# Patient Record
Sex: Female | Born: 1975
Health system: Southern US, Community
[De-identification: ages and names within clinical notes are randomized; demographics above are authoritative.]

## PROBLEM LIST (undated history)

## (undated) DIAGNOSIS — E669 Obesity, unspecified: Secondary | ICD-10-CM

## (undated) DIAGNOSIS — R945 Abnormal results of liver function studies: Secondary | ICD-10-CM

## (undated) DIAGNOSIS — K8020 Calculus of gallbladder without cholecystitis without obstruction: Secondary | ICD-10-CM

## (undated) DIAGNOSIS — J309 Allergic rhinitis, unspecified: Secondary | ICD-10-CM

## (undated) DIAGNOSIS — R7303 Prediabetes: Secondary | ICD-10-CM

## (undated) DIAGNOSIS — O039 Complete or unspecified spontaneous abortion without complication: Secondary | ICD-10-CM

## (undated) DIAGNOSIS — D509 Iron deficiency anemia, unspecified: Secondary | ICD-10-CM

## (undated) DIAGNOSIS — M6740 Ganglion, unspecified site: Secondary | ICD-10-CM

## (undated) DIAGNOSIS — E785 Hyperlipidemia, unspecified: Secondary | ICD-10-CM

## (undated) DIAGNOSIS — T7840XA Allergy, unspecified, initial encounter: Secondary | ICD-10-CM

## (undated) DIAGNOSIS — H109 Unspecified conjunctivitis: Secondary | ICD-10-CM

## (undated) DIAGNOSIS — N946 Dysmenorrhea, unspecified: Secondary | ICD-10-CM

## (undated) DIAGNOSIS — K649 Unspecified hemorrhoids: Secondary | ICD-10-CM

## (undated) DIAGNOSIS — I10 Essential (primary) hypertension: Secondary | ICD-10-CM

## (undated) HISTORY — PX: GANGLION CYST EXCISION: SHX1691

## (undated) HISTORY — DX: Hyperlipidemia, unspecified: E78.5

## (undated) HISTORY — DX: Dysmenorrhea, unspecified: N94.6

## (undated) HISTORY — DX: Essential (primary) hypertension: I10

## (undated) HISTORY — DX: Ganglion, unspecified site: M67.40

## (undated) HISTORY — DX: Allergy, unspecified, initial encounter: T78.40XA

## (undated) HISTORY — DX: Unspecified conjunctivitis: H10.9

## (undated) HISTORY — DX: Abnormal results of liver function studies: R94.5

## (undated) HISTORY — DX: Iron deficiency anemia, unspecified: D50.9

## (undated) HISTORY — DX: Unspecified hemorrhoids: K64.9

## (undated) HISTORY — DX: Allergic rhinitis, unspecified: J30.9

## (undated) HISTORY — DX: Complete or unspecified spontaneous abortion without complication: O03.9

## (undated) HISTORY — DX: Obesity, unspecified: E66.9

## (undated) HISTORY — DX: Calculus of gallbladder without cholecystitis without obstruction: K80.20

---

## 1999-11-25 ENCOUNTER — Other Ambulatory Visit: Admission: RE | Admit: 1999-11-25 | Discharge: 1999-11-25 | Payer: Self-pay | Admitting: Family Medicine

## 2000-12-01 ENCOUNTER — Other Ambulatory Visit: Admission: RE | Admit: 2000-12-01 | Discharge: 2000-12-01 | Payer: Self-pay | Admitting: Family Medicine

## 2002-04-19 ENCOUNTER — Other Ambulatory Visit: Admission: RE | Admit: 2002-04-19 | Discharge: 2002-04-19 | Payer: Self-pay | Admitting: Family Medicine

## 2003-06-05 ENCOUNTER — Other Ambulatory Visit: Admission: RE | Admit: 2003-06-05 | Discharge: 2003-06-05 | Payer: Self-pay | Admitting: Family Medicine

## 2005-07-28 ENCOUNTER — Encounter: Payer: Self-pay | Admitting: Family Medicine

## 2005-07-28 ENCOUNTER — Ambulatory Visit: Payer: Self-pay | Admitting: Family Medicine

## 2005-07-28 ENCOUNTER — Other Ambulatory Visit: Admission: RE | Admit: 2005-07-28 | Discharge: 2005-07-28 | Payer: Self-pay | Admitting: Family Medicine

## 2005-07-28 LAB — CONVERTED CEMR LAB: Pap Smear: NORMAL

## 2005-09-03 ENCOUNTER — Ambulatory Visit: Payer: Self-pay | Admitting: Family Medicine

## 2005-10-03 ENCOUNTER — Ambulatory Visit: Payer: Self-pay | Admitting: Family Medicine

## 2005-10-13 ENCOUNTER — Ambulatory Visit: Payer: Self-pay | Admitting: Family Medicine

## 2005-10-20 ENCOUNTER — Ambulatory Visit: Payer: Self-pay | Admitting: Family Medicine

## 2005-11-17 HISTORY — PX: CHOLECYSTECTOMY: SHX55

## 2005-12-03 ENCOUNTER — Ambulatory Visit (HOSPITAL_COMMUNITY): Admission: RE | Admit: 2005-12-03 | Discharge: 2005-12-03 | Payer: Self-pay | Admitting: General Surgery

## 2005-12-05 ENCOUNTER — Ambulatory Visit: Payer: Self-pay | Admitting: Family Medicine

## 2006-01-14 ENCOUNTER — Other Ambulatory Visit: Admission: RE | Admit: 2006-01-14 | Discharge: 2006-01-14 | Payer: Self-pay | Admitting: Obstetrics and Gynecology

## 2006-07-21 ENCOUNTER — Inpatient Hospital Stay (HOSPITAL_COMMUNITY): Admission: AD | Admit: 2006-07-21 | Discharge: 2006-07-21 | Payer: Self-pay | Admitting: Obstetrics and Gynecology

## 2006-08-07 ENCOUNTER — Inpatient Hospital Stay (HOSPITAL_COMMUNITY): Admission: AD | Admit: 2006-08-07 | Discharge: 2006-08-09 | Payer: Self-pay | Admitting: Obstetrics and Gynecology

## 2006-11-13 ENCOUNTER — Ambulatory Visit: Payer: Self-pay | Admitting: General Surgery

## 2006-11-17 DIAGNOSIS — O039 Complete or unspecified spontaneous abortion without complication: Secondary | ICD-10-CM

## 2006-11-17 HISTORY — DX: Complete or unspecified spontaneous abortion without complication: O03.9

## 2007-08-16 ENCOUNTER — Encounter: Payer: Self-pay | Admitting: Family Medicine

## 2007-08-16 DIAGNOSIS — R945 Abnormal results of liver function studies: Secondary | ICD-10-CM

## 2007-08-16 DIAGNOSIS — K8020 Calculus of gallbladder without cholecystitis without obstruction: Secondary | ICD-10-CM | POA: Insufficient documentation

## 2007-08-16 DIAGNOSIS — J309 Allergic rhinitis, unspecified: Secondary | ICD-10-CM | POA: Insufficient documentation

## 2007-08-16 DIAGNOSIS — N946 Dysmenorrhea, unspecified: Secondary | ICD-10-CM | POA: Insufficient documentation

## 2007-08-16 HISTORY — DX: Abnormal results of liver function studies: R94.5

## 2007-08-26 ENCOUNTER — Ambulatory Visit: Payer: Self-pay | Admitting: Family Medicine

## 2007-08-26 DIAGNOSIS — D509 Iron deficiency anemia, unspecified: Secondary | ICD-10-CM | POA: Insufficient documentation

## 2007-08-27 LAB — CONVERTED CEMR LAB
ALT: 46 units/L — ABNORMAL HIGH (ref 0–35)
AST: 33 units/L (ref 0–37)
Albumin: 4.1 g/dL (ref 3.5–5.2)
Alkaline Phosphatase: 91 units/L (ref 39–117)
Basophils Absolute: 0.1 10*3/uL (ref 0.0–0.1)
Basophils Relative: 0.8 % (ref 0.0–1.0)
Bilirubin, Direct: 0.1 mg/dL (ref 0.0–0.3)
Cholesterol: 193 mg/dL (ref 0–200)
Direct LDL: 134.7 mg/dL
Eosinophils Absolute: 0.2 10*3/uL (ref 0.0–0.6)
Eosinophils Relative: 2.1 % (ref 0.0–5.0)
Glucose, Bld: 99 mg/dL (ref 70–99)
HCT: 38.8 % (ref 36.0–46.0)
HDL: 28.1 mg/dL — ABNORMAL LOW (ref >39.0)
Hemoglobin: 13.2 g/dL (ref 12.0–15.0)
Lymphocytes Relative: 21.2 % (ref 12.0–46.0)
MCHC: 34.1 g/dL (ref 30.0–36.0)
MCV: 80.6 fL (ref 78.0–100.0)
Monocytes Absolute: 0.7 10*3/uL (ref 0.2–0.7)
Monocytes Relative: 7.2 % (ref 3.0–11.0)
Neutro Abs: 7.1 10*3/uL (ref 1.4–7.7)
Neutrophils Relative %: 68.7 % (ref 43.0–77.0)
Platelets: 311 10*3/uL (ref 150–400)
RBC: 4.81 M/uL (ref 3.87–5.11)
RDW: 14 % (ref 11.5–14.6)
Total Bilirubin: 0.8 mg/dL (ref 0.3–1.2)
Total CHOL/HDL Ratio: 6.9
Total Protein: 8.1 g/dL (ref 6.0–8.3)
Triglycerides: 243 mg/dL (ref 0–149)
VLDL: 49 mg/dL — ABNORMAL HIGH (ref 0–40)
WBC: 10.3 10*3/uL (ref 4.5–10.5)

## 2008-01-26 ENCOUNTER — Ambulatory Visit: Payer: Self-pay | Admitting: Family Medicine

## 2008-01-27 ENCOUNTER — Telehealth: Payer: Self-pay | Admitting: Family Medicine

## 2009-08-23 ENCOUNTER — Inpatient Hospital Stay (HOSPITAL_COMMUNITY): Admission: AD | Admit: 2009-08-23 | Discharge: 2009-08-23 | Payer: Self-pay | Admitting: Obstetrics and Gynecology

## 2009-08-24 ENCOUNTER — Observation Stay (HOSPITAL_COMMUNITY): Admission: AD | Admit: 2009-08-24 | Discharge: 2009-08-24 | Payer: Self-pay | Admitting: Obstetrics and Gynecology

## 2009-08-26 ENCOUNTER — Inpatient Hospital Stay (HOSPITAL_COMMUNITY): Admission: AD | Admit: 2009-08-26 | Discharge: 2009-08-28 | Payer: Self-pay | Admitting: Obstetrics and Gynecology

## 2009-08-26 ENCOUNTER — Encounter (HOSPITAL_COMMUNITY): Payer: Self-pay | Admitting: Obstetrics and Gynecology

## 2009-11-17 ENCOUNTER — Ambulatory Visit: Payer: Self-pay | Admitting: Family Medicine

## 2010-02-12 ENCOUNTER — Ambulatory Visit: Payer: Self-pay | Admitting: Family Medicine

## 2010-02-12 DIAGNOSIS — M6740 Ganglion, unspecified site: Secondary | ICD-10-CM | POA: Insufficient documentation

## 2010-02-13 ENCOUNTER — Encounter: Payer: Self-pay | Admitting: Family Medicine

## 2010-02-26 ENCOUNTER — Ambulatory Visit (HOSPITAL_BASED_OUTPATIENT_CLINIC_OR_DEPARTMENT_OTHER): Admission: RE | Admit: 2010-02-26 | Discharge: 2010-02-26 | Payer: Self-pay | Admitting: Orthopedic Surgery

## 2010-03-19 ENCOUNTER — Telehealth: Payer: Self-pay | Admitting: Family Medicine

## 2010-03-19 ENCOUNTER — Emergency Department: Payer: Self-pay | Admitting: Emergency Medicine

## 2010-03-20 ENCOUNTER — Ambulatory Visit: Payer: Self-pay | Admitting: Family Medicine

## 2010-03-21 ENCOUNTER — Ambulatory Visit: Payer: Self-pay | Admitting: Family Medicine

## 2010-03-27 ENCOUNTER — Telehealth: Payer: Self-pay | Admitting: Family Medicine

## 2010-08-26 ENCOUNTER — Ambulatory Visit: Payer: Self-pay | Admitting: Family Medicine

## 2010-09-25 ENCOUNTER — Ambulatory Visit: Payer: Self-pay | Admitting: Family Medicine

## 2010-09-25 DIAGNOSIS — J019 Acute sinusitis, unspecified: Secondary | ICD-10-CM | POA: Insufficient documentation

## 2010-10-17 ENCOUNTER — Ambulatory Visit: Payer: Self-pay | Admitting: Internal Medicine

## 2010-10-17 DIAGNOSIS — N390 Urinary tract infection, site not specified: Secondary | ICD-10-CM | POA: Insufficient documentation

## 2010-10-17 LAB — CONVERTED CEMR LAB
Bilirubin Urine: NEGATIVE
Glucose, Urine, Semiquant: NEGATIVE
Ketones, urine, test strip: NEGATIVE
Nitrite: NEGATIVE
Specific Gravity, Urine: 1.025
Urobilinogen, UA: 0.2
pH: 5

## 2010-10-18 ENCOUNTER — Encounter: Payer: Self-pay | Admitting: Family Medicine

## 2010-12-17 NOTE — Assessment & Plan Note (Signed)
Summary: ?SINUS INFECTION,?UTI/CLE   Vital Signs:  Patient profile:   35 year old female Weight:      243.50 pounds Temp:     98.6 degrees F oral Pulse rate:   80 / minute Pulse rhythm:   regular BP sitting:   140 / 82  (left arm) Cuff size:   large  Vitals Entered By: Selena Batten Dance CMA Duncan Dull) (October 17, 2010 11:37 AM) CC: ? UTI Comments Still has some sinus issues as well   History of Present Illness: CC: ? UTI  1d h/o frequency, urgency, dysuria at end of stream.  No fevers/chills, abd pain, n/v.  + mild L flank pain yesterday, not today.  Drinking plenty of water.  No UTI in 10 years.  Seen a few weeks ago by PCP for sinus infection.  s/p Amoxicillin x 10 days.  Seems to be returning.  In am nasal discharge with yellow sputum.  + congestion.  No fevers/chills.  No ST, cough.  husband smokes outside.  + 2 daughters sick at home.  Current Medications (verified): 1)  Elocon 0.1 %  Crea (Mometasone Furoate) .... Apply To Affected Area Daily As Directed As Needed 2)  Amlodipine Besylate 10 Mg Tabs (Amlodipine Besylate) .... Take 1 Tablet By Mouth Once A Day 3)  Zyrtec Allergy 10 Mg Tabs (Cetirizine Hcl) .... Otc As Directed. 4)  Multivitamins   Tabs (Multiple Vitamin) .... Take One Daily 5)  Tylenol Cold Severe Congestion 30-15-200-325 Mg Tabs (Pseudoephedrine-Dm-Gg-Apap) .... Otc As Directed.  Allergies: 1)  Neosporin 2)  Betadine  Past History:  Past Medical History: Last updated: 08/16/2007 Allergic rhinitis Cholelithiasis Hyperlipidemia  Social History: Last updated: 08/26/2007 Marital Status: Married Children:  Occupation: [employer] quit smoking 2006  Review of Systems       per HPI  Physical Exam  General:  overweight but generally well appearing  Head:  normocephalic, atraumatic, and no abnormalities observed.  no sinus tenderness Eyes:  PERRLA, no injection Ears:  TMs clear bilaterally Nose:  nares clear Mouth:  MMM, no pharyngeal erythema Neck:   No deformities, masses, or tenderness noted.  No LAD Lungs:  Normal respiratory effort, chest expands symmetrically. Lungs are clear to auscultation, no crackles or wheezes. Heart:  Normal rate and regular rhythm. S1 and S2 normal without gallop, murmur, click, rub or other extra sounds. Abdomen:  Bowel sounds positive,abdomen soft and non-tender without masses, organomegaly or hernias noted.  minimal suprapubic tenderness, no CVA tenderness Pulses:  2+ rad pulses Skin:  Intact without suspicious lesions or rashes  c  Impression & Recommendations:  Problem # 1:  UTI (ICD-599.0) UA consistent with infection.  complicated given recent abx use.  send culture.  treat with bactrim DS twice daily for 5 days.  The following medications were removed from the medication list:    Augmentin 875-125 Mg Tabs (Amoxicillin-pot clavulanate) .Marland Kitchen... 1 by mouth two times a day for 10 days Her updated medication list for this problem includes:    Bactrim Ds 800-160 Mg Tabs (Sulfamethoxazole-trimethoprim) .Marland Kitchen... Take one by mouth two times a day x 5 days  Orders: Specimen Handling (16109) T-Culture, Urine ([id number]) UA Dipstick W/ Micro (manual) (81000)  Problem # 2:  ALLERGIC RHINITIS (ICD-477.9) sounds like more viral/allergic however giving abx for UTI.  supportive care, nasal saline.  pt off flonase 2/2 preference  The following medications were removed from the medication list:    Flonase 50 Mcg/act Susp (Fluticasone propionate) .Marland Kitchen... 2 sprays in each nostril  daily as needed Her updated medication list for this problem includes:    Zyrtec Allergy 10 Mg Tabs (Cetirizine hcl) ..... Otc as directed.  Complete Medication List: 1)  Elocon 0.1 % Crea (Mometasone furoate) .... Apply to affected area daily as directed as needed 2)  Amlodipine Besylate 10 Mg Tabs (Amlodipine besylate) .... Take 1 tablet by mouth once a day 3)  Zyrtec Allergy 10 Mg Tabs (Cetirizine hcl) .... Otc as directed. 4)   Multivitamins Tabs (Multiple vitamin) .... Take one daily 5)  Tylenol Cold Severe Congestion 30-15-200-325 Mg Tabs (Pseudoephedrine-dm-gg-apap) .... Otc as directed. 6)  Bactrim Ds 800-160 Mg Tabs (Sulfamethoxazole-trimethoprim) .... Take one by mouth two times a day x 5 days  Patient Instructions: 1)  Looks like infection in urine - treat with bactrim twice daily for 5 days.  Should clear up. 2)  As far as sinuses, consider nasal saline spray.  bactrim should help with this as well, ensure you are gettting plenty of fluid. 3)  Let us know if not better after antibiotics.  Let us know if any fevers/chills, or worsening sinus pressure. Prescriptions: BACTRIM DS 800-160 MG TABS (SULFAMETHOXAZOLE-TRIMETHOPRIM) take one by mouth two times a day x 5 days  #10 x 0   Entered and Authorized by:   Eustaquio Boyden  MD   Signed by:   Eustaquio Boyden  MD on 10/17/2010   Method used:   Electronically to        [facility]       [address]       [place], [place]  [address]       Ph: [phone]       Fax: [phone]   RxID:   [id number]    Orders Added: 1)  Specimen Handling [99000] 2)  T-Culture, Urine [[id number]] 3)  Est. Patient Level III [84166] 4)  UA Dipstick W/ Micro (manual) [81000]    Current Allergies (reviewed today): NEOSPORIN BETADINE  Laboratory Results   Urine Tests  Date/Time Received: October 17, 2010 11:45 AM  Date/Time Reported: October 17, 2010 11:46 AM   Routine Urinalysis   Color: yellow Appearance: Cloudy Glucose: negative   (Normal Range: Negative) Bilirubin: negative   (Normal Range: Negative) Ketone: negative   (Normal Range: Negative) Spec. Gravity: 1.025   (Normal Range: 1.003-1.035) Blood: large   (Normal Range: Negative) pH: 5.0   (Normal Range: 5.0-8.0) Protein: trace   (Normal Range: Negative) Urobilinogen: 0.2   (Normal Range: 0-1) Nitrite: negative   (Normal Range:  Negative) Leukocyte Esterace: moderate   (Normal Range: Negative)  Urine Microscopic WBC/HPF: 5-10 RBC/HPF: 10-20 Bacteria/HPF: 1+ Mucous/HPF: no Epithelial/HPF: 1-5 Crystals/HPF: no Casts/LPF: no    Comments: read by ................Eustaquio Boyden  MD  October 17, 2010 12:15 PM  UCx sent

## 2010-12-17 NOTE — Progress Notes (Signed)
Summary: hands itching   Phone Note Call from Patient Call back at [facility] Phone [phone]   Caller: Patient Call For: dr tower Summary of Call: pt saw you yesterday, was given elocon for dermatitis on hands and foot,,   itching badly today and asks if she can use aveeno itch cream as  well or do you suggest something else Initial call taken by: Lowella Petties,  January 27, 2008 9:40 AM  Follow-up for Phone Call        I recommend oral benadryl again (I think she was taking it before) for the itch update me if not improved Follow-up by: Judith Part MD,  January 27, 2008 1:29 PM  Additional Follow-up for Phone Call Additional follow up Details #1::        Pt. notifed as instructed. Additional Follow-up by: Sydell Axon,  January 27, 2008 2:04 PM

## 2010-12-17 NOTE — Progress Notes (Signed)
Summary: pt has sliced finger  Phone Note Call from Patient   Caller: Spouse Summary of Call: Husband called to say that pt has sliced the tip of her finger off while cutting vegetables.  Advised him that pt needs to go to ER or urgent care to be seen. Initial call taken by: Lowella Petties CMA,  Mar 19, 2010 3:19 PM  Follow-up for Phone Call        agree with above  Follow-up by: Judith Part MD,  Mar 19, 2010 3:45 PM

## 2010-12-17 NOTE — Assessment & Plan Note (Signed)
Summary: PINK EYE/RBH   Vital Signs:  Patient profile:   35 year old female Weight:      235.75 pounds Temp:     99.5 degrees F oral Pulse rate:   84 / minute Pulse rhythm:   regular BP sitting:   138 / 80  (left arm) Cuff size:   large  Vitals Entered By: Sydell Axon LPN (Mar 20, 453 12:24 PM) CC: ? pink eye, eyes are red with drainage   History of Present Illness: Pt here with Dad and two daughters. Oldest daughter has had URI sxs, poss allergies and "Goopy eyes" since Sun. Ms Belle has had slight discarge and mild inflammation since late Mon nite. She otherwise has no other sxs and feels well. She had cutoff a very small segment of her finger and was seen in the ER yesterday....to be seen again tomm at 48hrs. She has washed the eyes repeatedly with good results but discharge continues.  Problems Prior to Update: 1)  Ganglion Cyst  (ICD-727.43) 2)  Diabetes Mellitus, Type II, Family Hx  (ICD-V18.0) 3)  Anemia, Iron Deficiency Nos  (ICD-280.9) 4)  Neoplasm, Malignant, Colon, Family Hx, Mother  (ICD-V16.0) 5)  Hx of Liver Function Tests, Abnormal  (ICD-794.8) 6)  Hx of Dysmenorrhea  (ICD-625.3) 7)  Hyperlipidemia  (ICD-272.4) 8)  Cholelithiasis  (ICD-574.20) 9)  Allergic Rhinitis  (ICD-477.9)  Medications Prior to Update: 1)  Flonase 50 Mcg/act  Susp (Fluticasone Propionate) .... 2 Sprays in Each Nostril Daily As Needed 2)  Elocon 0.1 %  Crea (Mometasone Furoate) .... Apply To Affected Area Daily As Directed Prn 3)  Nora-Be 0.35 Mg Tabs (Norethindrone) .... Take 1 Tablet By Mouth Once A Day 4)  Amlodipine Besylate 10 Mg Tabs (Amlodipine Besylate) .... Take 1 Tablet By Mouth Once A Day 5)  Zyrtec Allergy 10 Mg Tabs (Cetirizine Hcl) .... Otc As Directed. 6)  Multivitamins   Tabs (Multiple Vitamin) .... Take One Daily  Allergies: 1)  Neosporin 2)  Betadine  Physical Exam  General:  overweight but generally well appearing  Head:  normocephalic, atraumatic, and no  abnormalities observed.   Eyes:  Irritation of conjunctiva, lower palpebral >upper> bulbar with slight discharge bilat. Lungs:  Normal respiratory effort, chest expands symmetrically. Lungs are clear to auscultation, no crackles or wheezes. Heart:  Normal rate and regular rhythm. S1 and S2 normal without gallop, murmur, click, rub or other extra sounds.   Impression & Recommendations:  Problem # 1:  CONJUNCTIVITIS (ICD-372.30) Assessment New  Discussed hygiene. Use drops minimum of one week. Her updated medication list for this problem includes:    Bleph-10 10 % Soln (Sulfacetamide sodium) .Marland Kitchen... 2 gtts each eye three times a day  Discussed treatment, and urged patient to wash hands carefully after touching face.   Complete Medication List: 1)  Flonase 50 Mcg/act Susp (Fluticasone propionate) .... 2 sprays in each nostril daily as needed 2)  Elocon 0.1 % Crea (Mometasone furoate) .... Apply to affected area daily as directed prn 3)  Nora-be 0.35 Mg Tabs (Norethindrone) .... Take 1 tablet by mouth once a day 4)  Amlodipine Besylate 10 Mg Tabs (Amlodipine besylate) .... Take 1 tablet by mouth once a day 5)  Zyrtec Allergy 10 Mg Tabs (Cetirizine hcl) .... Otc as directed. 6)  Multivitamins Tabs (Multiple vitamin) .... Take one daily 7)  Bleph-10 10 % Soln (Sulfacetamide sodium) .... 2 gtts each eye three times a day  Patient Instructions: 1)  RTC if  sxs don't resolve. Prescriptions: BLEPH-10 10 % SOLN (SULFACETAMIDE SODIUM) 2 gtts each eye three times a day  #1 bottle x 0   Entered and Authorized by:   Shaune Leeks MD   Signed by:   Shaune Leeks MD on 03/20/2010   Method used:   Electronically to        [facility] ([facility])       [address]       [place], [place]  [address]       Ph: [phone]       Fax: [phone]   RxID:   [id number]   Current Allergies (reviewed today): NEOSPORIN BETADINE

## 2010-12-17 NOTE — Assessment & Plan Note (Signed)
Summary: tower flu shot/rbh   Nurse Visit   Allergies: 1)  Neosporin 2)  Betadine  Orders Added: 1)  Admin 1st Vaccine [90471] 2)  Flu Vaccine 47yrs + [16109]        Flu Vaccine Consent Questions     Do you have a history of severe allergic reactions to this vaccine? no    Any prior history of allergic reactions to egg and/or gelatin? no    Do you have a sensitivity to the preservative Thimersol? no    Do you have a past history of Guillan-Barre Syndrome? no    Do you currently have an acute febrile illness? no    Have you ever had a severe reaction to latex? no    Vaccine information given and explained to patient? yes    Are you currently pregnant? no    Lot Number:[id number]   Exp Date:05/17/2011   Site Given  Left Deltoid IMu Lewanda Rife LPN  August 26, 2010 10:59 AM

## 2010-12-17 NOTE — Assessment & Plan Note (Signed)
Summary: WRIST CALCIUM DEPOSIT?   Vital Signs:  Patient profile:   35 year old female Height:      68.25 inches Weight:      239.25 pounds BMI:     36.24 Temp:     99.1 degrees F oral Pulse rate:   96 / minute Pulse rhythm:   regular BP sitting:   142 / 78  (left arm) Cuff size:   large  Vitals Entered By: Lewanda Rife LPN (February 12, 2010 9:07 AM) CC: ?knot on rt wrist   History of Present Illness: knot on R wrist - came up in december -- was smaller - thought it was ca dep  now bigger  is annoying  occasionally it hurts if she uses wrist a lot   Allergies: 1)  Neosporin 2)  Betadine  Past History:  Past Medical History: Last updated: 08/16/2007 Allergic rhinitis Cholelithiasis Hyperlipidemia  Past Surgical History: Last updated: 08/16/2007 Abn [place]- gallstones (09/2005) Cholecystectomy  Family History: Last updated: 08/26/2007 Father: DM Mother:colon ca  Siblings:  MGM colon tumor  Social History: Last updated: 08/26/2007 Marital Status: Married Children:  Occupation: [employer] quit smoking 2006  Risk Factors: Smoking Status: quit (08/16/2007)  Review of Systems General:  Denies chills, fatigue, and fever. Eyes:  Denies blurring. CV:  Denies chest pain or discomfort and palpitations. Resp:  Denies cough and shortness of breath. MS:  Denies joint redness, joint swelling, cramps, and muscle weakness. Derm:  Denies poor wound healing and rash. Neuro:  Denies numbness, tingling, and weakness. Heme:  Denies abnormal bruising and bleeding.  Physical Exam  General:  overweight but generally well appearing  Head:  normocephalic, atraumatic, and no abnormalities observed.   Heart:  Normal rate and regular rhythm. S1 and S2 normal without gallop, murmur, click, rub or other extra sounds. Msk:  R wrist - dorsal 1 cm rubbery mobile mass consistent with ganglion cyst mildly tender no redness nl rom wrist - well perfused Pulses:  R and L  carotid,radial,femoral,dorsalis pedis and posterior tibial pulses are full and equal bilaterally Extremities:  No clubbing, cyanosis, edema, or deformity noted with normal full range of motion of all joints.   Neurologic:  sensation intact to light touch, gait normal, and DTRs symmetrical and normal.   Skin:  Intact without suspicious lesions or rashes Cervical Nodes:  No lymphadenopathy noted Psych:  normal affect, talkative and pleasant    Impression & Recommendations:  Problem # 1:  GANGLION CYST (ICD-727.43) Assessment New post R wrist that is bothersome - though rom is normal  will ref to hand specialist disc opt for tx -- she is interested  Orders: Orthopedic Referral (Ortho)  Complete Medication List: 1)  Flonase 50 Mcg/act Susp (Fluticasone propionate) .... 2 sprays in each nostril daily as needed 2)  Elocon 0.1 % Crea (Mometasone furoate) .... Apply to affected area daily as directed prn 3)  Nora-be 0.35 Mg Tabs (Norethindrone) .... Take 1 tablet by mouth once a day 4)  Amlodipine Besylate 10 Mg Tabs (Amlodipine besylate) .... Take 1 tablet by mouth once a day 5)  Zyrtec Allergy 10 Mg Tabs (Cetirizine hcl) .... Otc as directed. 6)  Multivitamins Tabs (Multiple vitamin) .... Take one daily  Patient Instructions: 1)  you have a ganglion cyst on wrist  2)  we will do a hand doctor referral at check out   Current Allergies (reviewed today): NEOSPORIN BETADINE

## 2010-12-17 NOTE — Assessment & Plan Note (Signed)
Summary: DERMATITIS ON HANDS  Medications Added ELOCON 0.1 %  CREA (MOMETASONE FUROATE) apply to affected area daily as directed prn        Vital Signs:  Patient Profile:   35 Years Old Female Weight:      218 pounds Temp:     99 degrees F oral Pulse rate:   80 / minute Pulse rhythm:   regular BP sitting:   144 / 90  (left arm) Cuff size:   large  Vitals Entered By: Lowella Petties (January 26, 2008 12:41 PM)                 Chief Complaint:  Itchy rash on hands.  History of Present Illness: has had some problems with hands with reaction to betadyne and neosporin in the past  some itching/rash started on monday- itchy spot on base of R thumb last nt fingers started swelling up blisters under the skin (tight)- not severely itchy maybe spot on foot too   this weekend did use bubble solution, and new comforter on bed   tends to have dry flaky skin on hands in winter too right now is using some benadryl and     Current Allergies: NEOSPORIN BETADINE     Review of Systems  General      Denies fatigue and fever.  Eyes      Denies eye irritation.  Resp      Denies wheezing.  Derm      Complains of itching and rash.  Neuro      Denies numbness.  Allergy      Complains of seasonal allergies.   Physical Exam  General:     Well-developed,well-nourished,in no acute distress; alert,appropriate and cooperative throughout examination Eyes:     vision grossly intact, pupils equal, pupils round, pupils reactive to light, and no injection.   Skin:     some papules/ ? vesicles under skin on palms of hands- some excoriation and no skin breakdown some general dryness Cervical Nodes:     No lymphadenopathy noted Psych:     nl affect    Impression & Recommendations:  Problem # 1:  CONTACT DERMATITIS&OTHER ECZEMA DUE DETERGENTS (ICD-692.0) Assessment: Deteriorated features of eczema- suspect from detergent reaction will use elocon as needed and  moisturizer avoid detergent contact- and update if not improved Her updated medication list for this problem includes:    Elocon 0.1 % Crea (Mometasone furoate) .Marland Kitchen... Apply to affected area daily as directed prn   Complete Medication List: 1)  Flonase 50 Mcg/act Susp (Fluticasone propionate) .... 2 sprays in each nostril qd 2)  Elocon 0.1 % Crea (Mometasone furoate) .... Apply to affected area daily as directed prn   Patient Instructions: 1)  get some eucerin or lubriderm lotion with no fragrance or color 2)  try not to get exposed to hot water 3)  use elocon cream - once daily for no more than 2 weeks 4)  update me if not improving    Prescriptions: ELOCON 0.1 %  CREA (MOMETASONE FUROATE) apply to affected area daily as directed prn  #1 small x 1   Entered and Authorized by:   Judith Part MD   Signed by:   Judith Part MD on 01/26/2008   Method used:   Print then Give to Patient   RxID:   [id number]  ]

## 2010-12-17 NOTE — Assessment & Plan Note (Signed)
Summary: ER FOLLOW UP CUT TIP OF FINGER/RBH   Vital Signs:  Patient profile:   35 year old female Height:      68.25 inches Weight:      235.2 pounds BMI:     35.63 Temp:     98.4 degrees F oral Pulse rate:   72 / minute Pulse rhythm:   regular BP sitting:   130 / 80  (left arm) Cuff size:   large  Vitals Entered By: Benny Lennert CMA Duncan Dull) (Mar 21, 2010 9:04 AM)  History of Present Illness: Chief complaint er follow up cut tip of finger  Sliced off the end of her finger a few days ago, bled a lot, went to ER. No stiches, was able to stop with a topical anticoagulant bandage  Feels ok now  ROS: no fever, chills, currently with pink eye Otherwise, ROS is as per the HPI.   GEN: Well-developed,well-nourished,in no acute distress; alert,appropriate and cooperative throughout examination HEENT: Normocephalic and atraumatic without obvious abnormalities. No apparent alopecia or balding. Ears, externally no deformities. Conjunctivitis PULM: Breathing comfortably in no respiratory distress EXT: No clubbing, cyanosis, or edema PSYCH: Normally interactive. Cooperative during the interview. Pleasant. Friendly and conversant. Not anxious or depressed appearing. Normal, full affect.   Finger, fully moveable, R hand, distal tip examined. 3rd. good scab formation  Allergies: 1)  Neosporin 2)  Betadine   Impression & Recommendations:  Problem # 1:  LACERATION, FINGER (ICD-883.0) healing well.  bacitracin only with bandage  f.u as needed   doi 03/19/2010  Complete Medication List: 1)  Flonase 50 Mcg/act Susp (Fluticasone propionate) .... 2 sprays in each nostril daily as needed 2)  Elocon 0.1 % Crea (Mometasone furoate) .... Apply to affected area daily as directed prn 3)  Nora-be 0.35 Mg Tabs (Norethindrone) .... Take 1 tablet by mouth once a day 4)  Amlodipine Besylate 10 Mg Tabs (Amlodipine besylate) .... Take 1 tablet by mouth once a day 5)  Zyrtec Allergy 10 Mg Tabs  (Cetirizine hcl) .... Otc as directed. 6)  Multivitamins Tabs (Multiple vitamin) .... Take one daily 7)  Bleph-10 10 % Soln (Sulfacetamide sodium) .... 2 gtts each eye three times a day  Current Allergies (reviewed today): NEOSPORIN BETADINE

## 2010-12-17 NOTE — Progress Notes (Signed)
Summary: finger is getting better  Phone Note Call from Patient Call back at Home Phone [phone]   Caller: Patient Call For: Judith Part MD Summary of Call: Pt had sliced the tip of her finger off on 5/03.  Wound has a nice scab on it now.  Pt is still using bacitracin and keeping it covered.  She asks if she should continue to do this or should she start letting it air out. Initial call taken by: Lowella Petties CMA,  Mar 27, 2010 8:39 AM  Follow-up for Phone Call        if it has a scab and no drainage or sign of infx- leave it open  cover it for dirty work or exp to bacteria to protect the wound  f/u if needed  Follow-up by: Judith Part MD,  Mar 27, 2010 10:46 AM  Additional Follow-up for Phone Call Additional follow up Details #1::        Advised pt. Additional Follow-up by: Lowella Petties CMA,  Mar 27, 2010 10:54 AM

## 2010-12-17 NOTE — Consult Note (Signed)
Summary: [facility]  [facility]   Imported By: Maryln Gottron 02/20/2010 13:21:12  _____________________________________________________________________  External Attachment:    Type:   Image     Comment:   External Document

## 2010-12-17 NOTE — Assessment & Plan Note (Signed)
Summary: COUGH/CLE   Vital Signs:  Patient profile:   35 year old female Height:      68.25 inches Temp:     99 degrees F oral Pulse rate:   80 / minute Pulse rhythm:   regular BP sitting:   124 / 80  (right arm) Cuff size:   large  Vitals Entered By: Lewanda Rife LPN (September 25, 2010 4:09 PM) CC: productive cough with green phlegm, when blows nose clear to green mucus and h/a and raw irritated throat.   History of Present Illness: has been sick since oct 30th -- started out with st and fever - of 100   then started getting better 1 day and now worse again now coughing up green phlegm  no more fever  is getting hoarse   no wheezing  hard to sleep with coughing all night   also her R eye is red and watery and itchy   blowing green discharge out of nose with blood  also facial pain at night - occ during day     Allergies: 1)  Neosporin 2)  Betadine  Past History:  Past Medical History: Last updated: 08/16/2007 Allergic rhinitis Cholelithiasis Hyperlipidemia  Past Surgical History: Last updated: 08/16/2007 Abn [place]- gallstones (09/2005) Cholecystectomy  Family History: Last updated: 08/26/2007 Father: DM Mother:colon ca  Siblings:  MGM colon tumor  Social History: Last updated: 08/26/2007 Marital Status: Married Children:  Occupation: [employer] quit smoking 2006  Risk Factors: Smoking Status: quit (08/16/2007)  Review of Systems General:  Complains of chills, fatigue, loss of appetite, and malaise. Eyes:  Complains of discharge and eye irritation; denies blurring. ENT:  Complains of earache, hoarseness, nasal congestion, postnasal drainage, sinus pressure, and sore throat. CV:  Denies chest pain or discomfort and palpitations. Resp:  Complains of cough; denies wheezing. GI:  Denies diarrhea, nausea, and vomiting. Derm:  Denies itching, lesion(s), poor wound healing, and rash. Neuro:  Complains of headaches.  Physical Exam  General:   overweight but generally well appearing  Head:  mild maxillary sinus tenderness normocephalic, atraumatic, and no abnormalities observed.   Eyes:  vision grossly intact, pupils equal, pupils round, and pupils reactive to light.  no conjunctival pallor, injection or icterus  Ears:  R ear normal and L ear normal.   Nose:  nares are injected and congested bilaterally  Mouth:  pharynx pink and moist, no erythema, and no exudates.  some post nasal drip Neck:  No deformities, masses, or tenderness noted. Lungs:  Normal respiratory effort, chest expands symmetrically. Lungs are clear to auscultation, no crackles or wheezes. Heart:  Normal rate and regular rhythm. S1 and S2 normal without gallop, murmur, click, rub or other extra sounds. Skin:  Intact without suspicious lesions or rashes Cervical Nodes:  No lymphadenopathy noted Psych:  normal affect, talkative and pleasant    Impression & Recommendations:  Problem # 1:  SINUSITIS - ACUTE-NOS (ICD-461.9) Assessment New  will tx with augmentin (aware this can affect OC)  fluids/ rest/ cod cough syrup with caution pt advised to update me if symptoms worsen or do not improve - esp facial pain or fever  Her updated medication list for this problem includes:    Flonase 50 Mcg/act Susp (Fluticasone propionate) .Mar[facility]... 2 sprays in each nostril daily as needed    Tylenol Cold Severe Congestion 30-15-200-325 Mg Tabs (Pseudoephedrine-dm-gg-apap) ..... Otc as directed.    Augmentin 875-125 Mg Tabs (Amoxicillin-pot clavulanate) .Marland Kitchen... 1 by mouth two times a day for  10 days    Guaiatussin Ac 100-10 Mg/29ml Syrp (Guaifenesin-codeine) .Marland Kitchen... 1-2 teaspoons by mouth as needed for cough up to every 4-6 hours  Orders: Prescription Created Electronically [phone])  Complete Medication List: 1)  Flonase 50 Mcg/act Susp (Fluticasone propionate) .... 2 sprays in each nostril daily as needed 2)  Elocon 0.1 % Crea (Mometasone furoate) .... Apply to affected area daily as  directed as needed 3)  Amlodipine Besylate 10 Mg Tabs (Amlodipine besylate) .... Take 1 tablet by mouth once a day 4)  Zyrtec Allergy 10 Mg Tabs (Cetirizine hcl) .... Otc as directed. 5)  Multivitamins Tabs (Multiple vitamin) .... Take one daily 6)  Camila 0.35 Mg Tabs (Norethindrone) .... Take 1 tablet by mouth once a day 7)  Tylenol Cold Severe Congestion 30-15-200-325 Mg Tabs (Pseudoephedrine-dm-gg-apap) .... Otc as directed. 8)  Augmentin 875-125 Mg Tabs (Amoxicillin-pot clavulanate) .Marland Kitchen.. 1 by mouth two times a day for 10 days 9)  Guaiatussin Ac 100-10 Mg/69ml Syrp (Guaifenesin-codeine) .Marland Kitchen.. 1-2 teaspoons by mouth as needed for cough up to every 4-6 hours  Patient Instructions: 1)  drink lots of fluids and try to get some rest  2)  take the augmentin for sinus infection  3)  try the codiene cough syrup for cough with caution  4)  update me if not improved next week  Prescriptions: GUAIATUSSIN AC 100-10 MG/5ML SYRP (GUAIFENESIN-CODEINE) 1-2 teaspoons by mouth as needed for cough up to every 4-6 hours  #120cc x 0   Entered and Authorized by:   Judith Part MD   Signed by:   Judith Part MD on 09/25/2010   Method used:   Print then Give to Patient   RxID:   [id number] AUGMENTIN 875-125 MG TABS (AMOXICILLIN-POT CLAVULANATE) 1 by mouth two times a day for 10 days  #20 x 0   Entered and Authorized by:   Judith Part MD   Signed by:   Judith Part MD on 09/25/2010   Method used:   Electronically to        [facility]       [address]       [place], [place]  [address]       Ph: [phone]       Fax: [phone]   RxID:   [id number]    Orders Added: 1)  Prescription Created Electronically [G8553] 2)  Est. Patient Level III [06237]    Current Allergies (reviewed today): NEOSPORIN BETADINE

## 2011-02-05 LAB — BASIC METABOLIC PANEL
BUN: 8 mg/dL (ref 6–23)
CO2: 24 mEq/L (ref 19–32)
Calcium: 9.1 mg/dL (ref 8.4–10.5)
Chloride: 109 mEq/L (ref 96–112)
Creatinine, Ser: 0.65 mg/dL (ref 0.4–1.2)
GFR calc Af Amer: >60 mL/min (ref >60)
GFR calc non Af Amer: >60 mL/min (ref >60)
Glucose, Bld: 140 mg/dL — ABNORMAL HIGH (ref 70–99)
Potassium: 3.5 mEq/L (ref 3.5–5.1)
Sodium: 138 mEq/L (ref 135–145)

## 2011-02-05 LAB — POCT HEMOGLOBIN-HEMACUE: Hemoglobin: 12.3 g/dL (ref 12.0–15.0)

## 2011-02-20 LAB — COMPREHENSIVE METABOLIC PANEL
ALT: 16 U/L (ref 0–35)
ALT: 17 U/L (ref 0–35)
AST: 41 U/L — ABNORMAL HIGH (ref 0–37)
AST: 43 U/L — ABNORMAL HIGH (ref 0–37)
Albumin: 2.7 g/dL — ABNORMAL LOW (ref 3.5–5.2)
Albumin: 2.9 g/dL — ABNORMAL LOW (ref 3.5–5.2)
Alkaline Phosphatase: 84 U/L (ref 39–117)
Alkaline Phosphatase: 89 U/L (ref 39–117)
BUN: 5 mg/dL — ABNORMAL LOW (ref 6–23)
BUN: 6 mg/dL (ref 6–23)
CO2: 18 mEq/L — ABNORMAL LOW (ref 19–32)
CO2: 20 mEq/L (ref 19–32)
Calcium: 9.3 mg/dL (ref 8.4–10.5)
Calcium: 9.3 mg/dL (ref 8.4–10.5)
Chloride: 104 mEq/L (ref 96–112)
Chloride: 107 mEq/L (ref 96–112)
Creatinine, Ser: 0.53 mg/dL (ref 0.4–1.2)
Creatinine, Ser: 0.64 mg/dL (ref 0.4–1.2)
GFR calc Af Amer: >60 mL/min (ref >60)
GFR calc Af Amer: >60 mL/min (ref >60)
GFR calc non Af Amer: >60 mL/min (ref >60)
GFR calc non Af Amer: >60 mL/min (ref >60)
Glucose, Bld: 103 mg/dL — ABNORMAL HIGH (ref 70–99)
Glucose, Bld: 82 mg/dL (ref 70–99)
Potassium: 3.6 mEq/L (ref 3.5–5.1)
Potassium: 4.2 mEq/L (ref 3.5–5.1)
Sodium: 132 mEq/L — ABNORMAL LOW (ref 135–145)
Sodium: 134 mEq/L — ABNORMAL LOW (ref 135–145)
Total Bilirubin: 0.4 mg/dL (ref 0.3–1.2)
Total Bilirubin: 0.5 mg/dL (ref 0.3–1.2)
Total Protein: 6.4 g/dL (ref 6.0–8.3)
Total Protein: 6.6 g/dL (ref 6.0–8.3)

## 2011-02-20 LAB — CBC
HCT: 25.3 % — ABNORMAL LOW (ref 36.0–46.0)
HCT: 34.2 % — ABNORMAL LOW (ref 36.0–46.0)
HCT: 35.4 % — ABNORMAL LOW (ref 36.0–46.0)
HCT: 37.6 % (ref 36.0–46.0)
Hemoglobin: 11.4 g/dL — ABNORMAL LOW (ref 12.0–15.0)
Hemoglobin: 11.9 g/dL — ABNORMAL LOW (ref 12.0–15.0)
Hemoglobin: 12.5 g/dL (ref 12.0–15.0)
Hemoglobin: 8.6 g/dL — ABNORMAL LOW (ref 12.0–15.0)
MCHC: 33.3 g/dL (ref 30.0–36.0)
MCHC: 33.4 g/dL (ref 30.0–36.0)
MCHC: 33.5 g/dL (ref 30.0–36.0)
MCHC: 34 g/dL (ref 30.0–36.0)
MCV: 83.8 fL (ref 78.0–100.0)
MCV: 83.8 fL (ref 78.0–100.0)
MCV: 84.1 fL (ref 78.0–100.0)
MCV: 84.2 fL (ref 78.0–100.0)
Platelets: 200 10*3/uL (ref 150–400)
Platelets: 233 10*3/uL (ref 150–400)
Platelets: 235 10*3/uL (ref 150–400)
Platelets: 240 10*3/uL (ref 150–400)
RBC: 3 MIL/uL — ABNORMAL LOW (ref 3.87–5.11)
RBC: 4.08 MIL/uL (ref 3.87–5.11)
RBC: 4.23 MIL/uL (ref 3.87–5.11)
RBC: 4.48 MIL/uL (ref 3.87–5.11)
RDW: 15.6 % — ABNORMAL HIGH (ref 11.5–15.5)
RDW: 15.6 % — ABNORMAL HIGH (ref 11.5–15.5)
RDW: 15.6 % — ABNORMAL HIGH (ref 11.5–15.5)
RDW: 15.7 % — ABNORMAL HIGH (ref 11.5–15.5)
WBC: 10.9 10*3/uL — ABNORMAL HIGH (ref 4.0–10.5)
WBC: 11.3 10*3/uL — ABNORMAL HIGH (ref 4.0–10.5)
WBC: 16.5 10*3/uL — ABNORMAL HIGH (ref 4.0–10.5)
WBC: 17.2 10*3/uL — ABNORMAL HIGH (ref 4.0–10.5)

## 2011-02-20 LAB — URINALYSIS, DIPSTICK ONLY
Bilirubin Urine: NEGATIVE
Glucose, UA: NEGATIVE mg/dL
Ketones, ur: NEGATIVE mg/dL
Nitrite: NEGATIVE
Protein, ur: NEGATIVE mg/dL
Specific Gravity, Urine: <1.005 — ABNORMAL LOW (ref 1.005–1.030)
Urobilinogen, UA: 0.2 mg/dL (ref 0.0–1.0)
pH: 6 (ref 5.0–8.0)

## 2011-02-20 LAB — URIC ACID
Uric Acid, Serum: 6.1 mg/dL (ref 2.4–7.0)
Uric Acid, Serum: 6.8 mg/dL (ref 2.4–7.0)

## 2011-02-20 LAB — RPR: RPR Ser Ql: NONREACTIVE

## 2011-02-20 LAB — LACTATE DEHYDROGENASE: LDH: 209 U/L (ref 94–250)

## 2011-10-22 ENCOUNTER — Ambulatory Visit (INDEPENDENT_AMBULATORY_CARE_PROVIDER_SITE_OTHER): Payer: BC Managed Care – PPO

## 2011-10-22 DIAGNOSIS — Z23 Encounter for immunization: Secondary | ICD-10-CM

## 2012-04-22 ENCOUNTER — Other Ambulatory Visit: Payer: Self-pay | Admitting: Obstetrics and Gynecology

## 2013-04-29 ENCOUNTER — Other Ambulatory Visit: Payer: Self-pay | Admitting: Obstetrics and Gynecology

## 2014-05-01 ENCOUNTER — Other Ambulatory Visit: Payer: Self-pay | Admitting: Obstetrics and Gynecology

## 2014-05-02 LAB — CYTOLOGY - PAP

## 2014-11-12 ENCOUNTER — Ambulatory Visit: Payer: Self-pay | Admitting: Physician Assistant

## 2014-11-27 ENCOUNTER — Ambulatory Visit: Payer: Self-pay

## 2014-11-27 LAB — RAPID STREP-A WITH REFLX: Micro Text Report: NEGATIVE

## 2014-11-29 LAB — BETA STREP CULTURE(ARMC)

## 2015-01-31 ENCOUNTER — Ambulatory Visit: Payer: Self-pay | Admitting: Family Medicine

## 2015-05-07 ENCOUNTER — Other Ambulatory Visit: Payer: Self-pay | Admitting: Obstetrics and Gynecology

## 2015-05-08 LAB — CYTOLOGY - PAP

## 2015-05-10 ENCOUNTER — Encounter: Payer: Self-pay | Admitting: Gastroenterology

## 2015-05-11 ENCOUNTER — Other Ambulatory Visit: Payer: Self-pay | Admitting: Obstetrics and Gynecology

## 2015-05-11 DIAGNOSIS — R928 Other abnormal and inconclusive findings on diagnostic imaging of breast: Secondary | ICD-10-CM

## 2015-05-22 ENCOUNTER — Ambulatory Visit
Admission: RE | Admit: 2015-05-22 | Discharge: 2015-05-22 | Disposition: A | Discharge status: Home / Self Care | Payer: BLUE CROSS/BLUE SHIELD | Source: Ambulatory Visit | Attending: Obstetrics and Gynecology | Admitting: Obstetrics and Gynecology

## 2015-05-22 DIAGNOSIS — R928 Other abnormal and inconclusive findings on diagnostic imaging of breast: Secondary | ICD-10-CM

## 2015-07-17 ENCOUNTER — Ambulatory Visit: Payer: Self-pay | Admitting: Gastroenterology

## 2015-07-19 HISTORY — PX: COLONOSCOPY: SHX174

## 2015-07-20 ENCOUNTER — Encounter: Payer: Self-pay | Admitting: Internal Medicine

## 2015-07-20 ENCOUNTER — Ambulatory Visit (INDEPENDENT_AMBULATORY_CARE_PROVIDER_SITE_OTHER): Payer: BLUE CROSS/BLUE SHIELD | Admitting: Internal Medicine

## 2015-07-20 VITALS — BP 126/84 | HR 72 | Ht 67.13 in | Wt 255.2 lb

## 2015-07-20 DIAGNOSIS — Z1211 Encounter for screening for malignant neoplasm of colon: Secondary | ICD-10-CM

## 2015-07-20 DIAGNOSIS — Z800 Family history of malignant neoplasm of digestive organs: Secondary | ICD-10-CM

## 2015-07-20 DIAGNOSIS — K589 Irritable bowel syndrome without diarrhea: Secondary | ICD-10-CM

## 2015-07-20 NOTE — Progress Notes (Signed)
Referred by Dr. Marcelle Overlie Subjective:    Patient ID: Deanna Potts, female    DOB: Apr 02, 1976, 39 y.o.   MRN: [id number] Chief complaint: Family history colon cancer, HPI  This is a pleasant 39 year old married woman whose mom was diagnosed with stage IV colon cancer at age 34 and died shortly after that. She has never had a colonoscopy. She admits to chronic intermittent postprandial cramps and loose urgent defecation for many years. She cannot define any particular triggers. She gets heartburn when she eats tomato-based foods. This is chronic and a self-limited thing. She will use an antiacid when necessary. Her GI review of systems is otherwise negative. Allergies  Allergen Reactions  . Betadine [Povidone Iodine]   . Neomycin-Bacitracin Zn-Polymyx     REACTION: ? reaction   No outpatient prescriptions prior to visit.   No facility-administered medications prior to visit.   Past Medical History  Diagnosis Date  . Allergic rhinitis   . Anemia, iron deficiency   . Hyperlipidemia   . Obesity   . Miscarriage 2008  . Ganglion cyst   . Abnormal liver function   . Dysmenorrhea   . Cholelithiasis   . Conjunctivitis   . Hemorrhoids    Past Surgical History  Procedure Laterality Date  . Wrist surgery      Cyst  . Cholecystectomy  2007   Social History   Social History  . Marital Status: Married    Spouse Name: N/A  . Number of Children: 2  . Years of Education: N/A   Occupational History  . Student    Social History Main Topics  . Smoking status: Former Smoker    Quit date: 11/17/2004  . Smokeless tobacco: Never Used  . Alcohol Use: No  . Drug Use: No  . Sexual Activity: Not Asked   Other Topics Concern  . 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 Diabetes  Father   . Colon cancer Mother   . Colon polyps Maternal Grandmother     benign but still had colectomy  . Heart disease Father   . Heart disease Paternal Grandfather        Review of Systems As per history of present illness. Some allergy and sinus problems which are chronic.    Objective:   Physical Exam  126/84 mmHg  Pulse 72  Ht 5' 7.13" (1.705 m)  Wt 255 lb 4 oz (115.781 kg)  BMI 39.83 kg/m2  LMP 07/19/2015 (Exact Date)@  General:  NAD Eyes:   anicteric Lungs:  clear Heart:: S1S2 no rubs, murmurs or gallops Abdomen:  soft and nontender, BS+ Ext:   no edema, cyanosis or clubbing        Assessment & Plan:   1. Colon cancer screening   2. Family history of colon cancer requiring screening colonoscopy   3. IBS (irritable bowel syndrome)    Colonoscopy is appropriate at this age given her family history.The risks and benefits as well as alternatives of endoscopic procedure(s) have been discussed and reviewed. All questions answered. The patient agrees to proceed.   I would give her a working diagnosis of irritable bowel syndrome based upon the chronic intermittent cramps and diarrhea.  I appreciate the opportunity to care for this  patient. I will send a copy to Dr. Marcelle Overlie

## 2015-07-20 NOTE — Patient Instructions (Signed)

## 2015-08-06 ENCOUNTER — Encounter: Payer: Self-pay | Admitting: Internal Medicine

## 2015-08-06 ENCOUNTER — Ambulatory Visit (AMBULATORY_SURGERY_CENTER): Payer: BLUE CROSS/BLUE SHIELD | Admitting: Internal Medicine

## 2015-08-06 VITALS — BP 136/79 | HR 72 | Temp 99.3°F | Resp 18 | Ht 67.0 in | Wt 255.0 lb

## 2015-08-06 DIAGNOSIS — Z800 Family history of malignant neoplasm of digestive organs: Secondary | ICD-10-CM

## 2015-08-06 DIAGNOSIS — Z1211 Encounter for screening for malignant neoplasm of colon: Secondary | ICD-10-CM

## 2015-08-06 MED ORDER — SODIUM CHLORIDE 0.9 % IV SOLN: 500.0000 mL | INTRAVENOUS | Status: DC

## 2015-08-06 NOTE — Patient Instructions (Addendum)
The colonoscopy was normal!  Your next routine colonoscopy should be in 5 years - 2021.  I am going to look into possible testing for a sugar digestion enzyme deficiency that you could have and it could be causing the urgent bowel movements after eating. Will contact you.  I appreciate the opportunity to care for you. Iva Boop, MD, [facility] FOLLOW DISCHARGE INSTRUCTIONS [facility] AND GREEN SHEETS).YOU HAD AN ENDOSCOPIC PROCEDURE TODAY AT THE [facility]:   Refer to the procedure report that was given to you for any specific questions about what was found during the examination.  If the procedure report does not answer your questions, please call your gastroenterologist to clarify.  If you requested that your care partner not be given the details of your procedure findings, then the procedure report has been included in a sealed envelope for you to review at your convenience later.  YOU SHOULD EXPECT: Some feelings of bloating in the abdomen. Passage of more gas than usual.  Walking can help get rid of the air that was put into your GI tract during the procedure and reduce the bloating. If you had a lower endoscopy (such as a colonoscopy or flexible sigmoidoscopy) you may notice spotting of blood in your stool or on the toilet paper. If you underwent a bowel prep for your procedure, you may not have a normal bowel movement for a few days.  Please Note:  You might notice some irritation and congestion in your nose or some drainage.  This is from the oxygen used during your procedure.  There is no need for concern and it should clear up in a day or so.  SYMPTOMS TO REPORT IMMEDIATELY:   Following lower endoscopy (colonoscopy or flexible sigmoidoscopy):  Excessive amounts of blood in the stool  Significant tenderness or worsening of abdominal pains  Swelling of the abdomen that is new, acute  Fever of 100F or higher   For urgent or emergent issues, a gastroenterologist can  be reached at any hour by calling [phone].   DIET: Your first meal following the procedure should be a small meal and then it is ok to progress to your normal diet. Heavy or fried foods are harder to digest and may make you feel nauseous or bloated.  Likewise, meals heavy in dairy and vegetables can increase bloating.  Drink plenty of fluids but you should avoid alcoholic beverages for 24 hours.  ACTIVITY:  You should plan to take it easy for the rest of today and you should NOT DRIVE or use heavy machinery until tomorrow (because of the sedation medicines used during the test).    FOLLOW UP: Our staff will call the number listed on your records the next business day following your procedure to check on you and address any questions or concerns that you may have regarding the information given to you following your procedure. If we do not reach you, we will leave a message.  However, if you are feeling well and you are not experiencing any problems, there is no need to return our call.  We will assume that you have returned to your regular daily activities without incident.  If any biopsies were taken you will be contacted by phone or by letter within the next 1-3 weeks.  Please call us at [phone] if you have not heard about the biopsies in 3 weeks.    SIGNATURES/CONFIDENTIALITY: You and/or your care partner have signed paperwork which will be  entered into your electronic medical record.  These signatures attest to the fact that that the information above on your After Visit Summary has been reviewed and is understood.  Full responsibility of the confidentiality of this discharge information lies with you and/or your care-partner.  Recall 5 years-2021

## 2015-08-06 NOTE — Op Note (Signed)
[facility] [address] [place], [address]   COLONOSCOPY PROCEDURE REPORT  PATIENT: Deanna Potts, Deanna Potts  MR#: [id number] BIRTHDATE: 15-Oct-1976 , 39  yrs. old GENDER: female ENDOSCOPIST: Iva Boop, MD, [facility] PROCEDURE DATE:  08/06/2015 PROCEDURE:   Colonoscopy, screening First Screening Colonoscopy - Avg.  risk and is 50 yrs.  old or older Yes.  Prior Negative Screening - Now for repeat screening. N/A  History of Adenoma - Now for follow-up colonoscopy & has been > or = to 3 yrs.  N/A  Polyps removed today? No Recommend repeat exam, <10 yrs? Yes high risk ASA CLASS:   Class II INDICATIONS:Screening for colonic neoplasia and FH Colon or Rectal Adenocarcinoma. MEDICATIONS: Propofol 250 mg IV and Monitored anesthesia care  DESCRIPTION OF PROCEDURE:   After the risks benefits and alternatives of the procedure were thoroughly explained, informed consent was obtained.  The digital rectal exam revealed no abnormalities of the rectum.   The LB [id number]  endoscope was introduced through the anus and advanced to the terminal ileum which was intubated for a short distance. No adverse events experienced.   The quality of the prep was excellent.  (MiraLax was used)  The instrument was then slowly withdrawn as the colon was fully examined. Estimated blood loss is zero unless otherwise noted in this procedure report.      COLON FINDINGS: A normal appearing cecum, ileocecal valve, and appendiceal orifice were identified.  The ascending, transverse, descending, sigmoid colon, and rectum appeared unremarkable.   The examined terminal ileum appeared to be normal.  Retroflexed views revealed no abnormalities. The time to cecum = 2.6 Withdrawal time = 7.5   The scope was withdrawn and the procedure completed. COMPLICATIONS: There were no immediate complications.  ENDOSCOPIC IMPRESSION: 1.   Normal colonoscopy 2.   The examined terminal ileum appeared to be  normal  RECOMMENDATIONS: 1.  Repeat Colonoscopy in 5 years. mother died of CRCA at age 81 2.  Consider testing for sucrase deficiency once I sort out how to do that - could be a cause of urgent defecation after eating  eSigned:  Iva Boop, MD, [facility] 08/06/2015 4:08 PM   cc: The Patient         Roxy Manns, MD

## 2015-08-06 NOTE — Progress Notes (Signed)
Transferred to recovery room. A/O x3, pleased with MAC.  VSS.  Report to Jane, RN. 

## 2015-08-07 ENCOUNTER — Telehealth: Payer: Self-pay | Admitting: *Deleted

## 2015-08-07 NOTE — Telephone Encounter (Signed)
  Follow up Call-  Call back number 08/06/2015  Post procedure Call Back phone  # [phone]  Permission to leave phone message Yes     Patient questions:  Do you have a fever, pain , or abdominal swelling? No. Pain Score  0 *  Have you tolerated food without any problems? Yes.    Have you been able to return to your normal activities? Yes.    Do you have any questions about your discharge instructions: Diet   No. Medications  No. Follow up visit  No.  Do you have questions or concerns about your Care? Pt was having sneezing.  Explained to her that this was normal after the oxygen she had yesterday.  Also explained that the cramping was normal and should subside by later today.  She will call back if things don't improve.    Actions: * If pain score is 4 or above: No action needed, pain <4.

## 2015-11-30 ENCOUNTER — Ambulatory Visit
Admission: EM | Admit: 2015-11-30 | Discharge: 2015-11-30 | Disposition: A | Discharge status: Home / Self Care | Payer: BLUE CROSS/BLUE SHIELD | Attending: Family Medicine | Admitting: Family Medicine

## 2015-11-30 ENCOUNTER — Encounter: Payer: Self-pay | Admitting: Gynecology

## 2015-11-30 DIAGNOSIS — N390 Urinary tract infection, site not specified: Secondary | ICD-10-CM | POA: Diagnosis not present

## 2015-11-30 LAB — URINALYSIS COMPLETE WITH MICROSCOPIC (ARMC ONLY)
Bilirubin Urine: NEGATIVE
Glucose, UA: NEGATIVE mg/dL
Ketones, ur: NEGATIVE mg/dL
Nitrite: NEGATIVE
Protein, ur: NEGATIVE mg/dL
Specific Gravity, Urine: 1.01 (ref 1.005–1.030)
pH: 5.5 (ref 5.0–8.0)

## 2015-11-30 LAB — GLUCOSE, CAPILLARY: Glucose-Capillary: 156 mg/dL — ABNORMAL HIGH (ref 65–99)

## 2015-11-30 MED ORDER — SULFAMETHOXAZOLE-TRIMETHOPRIM 800-160 MG PO TABS: 1.0000 | ORAL_TABLET | Freq: Two times a day (BID) | ORAL | Status: AC

## 2015-11-30 NOTE — ED Notes (Signed)
Patient c/o x today frequent urination/ light headed.

## 2015-11-30 NOTE — Discharge Instructions (Signed)
Take medication as prescribed. Rest. Drink plenty of fluids.   Follow up with your primary care physician this week. Return to Urgent care or proceed to ER for fever, abdominal pain, vomiting, new or worsening concerns.   Urinary Tract Infection Urinary tract infections (UTIs) can develop anywhere along your urinary tract. Your urinary tract is your body's drainage system for removing wastes and extra water. Your urinary tract includes two kidneys, two ureters, a bladder, and a urethra. Your kidneys are a pair of bean-shaped organs. Each kidney is about the size of your fist. They are located below your ribs, one on each side of your spine. CAUSES Infections are caused by microbes, which are microscopic organisms, including fungi, viruses, and bacteria. These organisms are so small that they can only be seen through a microscope. Bacteria are the microbes that most commonly cause UTIs. SYMPTOMS  Symptoms of UTIs may vary by age and gender of the patient and by the location of the infection. Symptoms in young women typically include a frequent and intense urge to urinate and a painful, burning feeling in the bladder or urethra during urination. Older women and men are more likely to be tired, shaky, and weak and have muscle aches and abdominal pain. A fever may mean the infection is in your kidneys. Other symptoms of a kidney infection include pain in your back or sides below the ribs, nausea, and vomiting. DIAGNOSIS To diagnose a UTI, your caregiver will ask you about your symptoms. Your caregiver will also ask you to provide a urine sample. The urine sample will be tested for bacteria and white blood cells. White blood cells are made by your body to help fight infection. TREATMENT  Typically, UTIs can be treated with medication. Because most UTIs are caused by a bacterial infection, they usually can be treated with the use of antibiotics. The choice of antibiotic and length of treatment depend on your  symptoms and the type of bacteria causing your infection. HOME CARE INSTRUCTIONS  If you were prescribed antibiotics, take them exactly as your caregiver instructs you. Finish the medication even if you feel better after you have only taken some of the medication.  Drink enough water and fluids to keep your urine clear or pale yellow.  Avoid caffeine, tea, and carbonated beverages. They tend to irritate your bladder.  Empty your bladder often. Avoid holding urine for long periods of time.  Empty your bladder before and after sexual intercourse.  After a bowel movement, women should cleanse from front to back. Use each tissue only once. SEEK MEDICAL CARE IF:   You have back pain.  You develop a fever.  Your symptoms do not begin to resolve within 3 days. SEEK IMMEDIATE MEDICAL CARE IF:   You have severe back pain or lower abdominal pain.  You develop chills.  You have nausea or vomiting.  You have continued burning or discomfort with urination. MAKE SURE YOU:   Understand these instructions.  Will watch your condition.  Will get help right away if you are not doing well or get worse.   This information is not intended to replace advice given to you by your health care provider. Make sure you discuss any questions you have with your health care provider.   Document Released: 08/13/2005 Document Revised: 07/25/2015 Document Reviewed: 12/12/2011 Elsevier Interactive Patient Education [employer]2016 [employer]

## 2015-11-30 NOTE — ED Provider Notes (Signed)
[facility]  ____________________________________________  Time seen: Approximately 8:17 PM  I have reviewed the triage vital signs and the nursing notes.   HISTORY  Chief Complaint Urinary Tract Infection    HPI Deanna Potts is a 40 y.o. female patient presents for the complaint of one day history of urinary frequency, urinary urgency. Patient reports that she did have a slight burning with urination noted earlier but none since. Also reports some intermittent suprapubic fullness and pressure. Denies otherwise abdominal pain. Patient also reports that overall she just does not feel well and feels rundown x one day. Reports some occasional low back pain. Denies fall or injury. Denies fevers.  Reports history of urinary tract infection last being approximately 1 year ago and at that time she felt similar. Denies vaginal complaints, vaginal discharge, vaginal odor or concerns for sexually transmitted diseases. Patient reports last menstrual was 2-3 weeks ago. Denies chance of pregnancy. Denies recent antibiotic use.   Denies chest pain, shortness of breath, abdominal pain, dizziness, nausea, vomiting, weakness, diarrhea.   PCP Dr. Lucretia Roers.    Past Medical History  Diagnosis Date  . Allergic rhinitis   . Anemia, iron deficiency   . Hyperlipidemia   . Obesity   . Miscarriage 2008  . Ganglion cyst   . Abnormal liver function   . Dysmenorrhea   . Cholelithiasis   . Conjunctivitis   . Hemorrhoids     Patient Active Problem List   Diagnosis Date Noted  . Family history of colon cancer in mother 08/06/2015  . UTI 10/17/2010  . GANGLION CYST 02/12/2010  . HYPERLIPIDEMIA 08/16/2007  . ALLERGIC RHINITIS 08/16/2007  . CHOLELITHIASIS 08/16/2007  . DYSMENORRHEA 08/16/2007  . LIVER FUNCTION TESTS, ABNORMAL 08/16/2007    Past Surgical History  Procedure Laterality Date  . Wrist surgery      Cyst  . Cholecystectomy  2007    Current Outpatient Rx  Name  Route   Sig  Dispense  Refill  . amLODipine-benazepril (LOTREL) 10-20 MG per capsule   Oral   Take 1 capsule by mouth daily.         . cetirizine (ZYRTEC) 10 MG tablet   Oral   Take 10 mg by mouth daily.          Last menstrual: 2-3 weeks ago. Denies chance of pregnancy.   Allergies Betadine and Neomycin-bacitracin zn-polymyx  Family History  Problem Relation Age of Onset  . Diabetes Father   . Colon cancer Mother     died at 85  . Colon polyps Maternal Grandmother     benign but still had colectomy  . Heart disease Father   . Heart disease Paternal Grandfather     Social History Social History  Substance Use Topics  . Smoking status: Former Smoker    Quit date: 11/17/2004  . Smokeless tobacco: Never Used  . Alcohol Use: No    Review of Systems Constitutional: No fever/chills Eyes: No visual changes. ENT: No sore throat. Cardiovascular: Denies chest pain. Respiratory: Denies shortness of breath. Gastrointestinal: No abdominal pain.  No nausea, no vomiting.  No diarrhea.  No constipation. Genitourinary Positive for dysuria. Musculoskeletal: Negative for back pain. Skin: Negative for rash. Neurological: Negative for headaches, focal weakness or numbness.  10-point ROS otherwise negative.  ____________________________________________   PHYSICAL EXAM:  VITAL SIGNS: ED Triage Vitals  Enc Vitals Group     BP 11/30/15 1930 137/81 mmHg     Pulse Rate 11/30/15 1930 103 Recheck 92  Resp 11/30/15 1930 16     Temp 11/30/15 1930 98.8 F (37.1 C)     Temp Source 11/30/15 1930 Oral     SpO2 11/30/15 1930 100 %     Weight 11/30/15 1930 245 lb (111.131 kg)     Height 11/30/15 1930 5\' 8"  (1.727 m)     Head Cir --      Peak Flow --      Pain Score 11/30/15 1933 0     Pain Loc --      Pain Edu? --      Excl. in GC? --     Constitutional: Alert and oriented. Well appearing and in no acute distress. Eyes: Conjunctivae are normal. PERRL. EOMI. Head:  Atraumatic.  Nose: No congestion/rhinnorhea.  Mouth/Throat: Mucous membranes are moist.  Oropharynx non-erythematous. Neck: No stridor.  No cervical spine tenderness to palpation. Hematological/Lymphatic/Immunilogical: No cervical lymphadenopathy. Cardiovascular: Normal rate, regular rhythm. Grossly normal heart sounds.  Good peripheral circulation. Respiratory: Normal respiratory effort.  No retractions. Lungs CTAB. Gastrointestinal: Soft and nontender. Obese abdomen.al Bowel sounds. No CVA tenderness. Musculoskeletal: No lower or upper extremity tenderness nor edema.  Bilateral pedal pulses equal and easily palpated.  Neurologic:  Normal speech and language. No gross focal neurologic deficits are appreciated. No gait instability. Skin:  Skin is warm, dry and intact. No rash noted. Psychiatric: Mood and affect are normal. Speech and behavior are normal.  ____________________________________________   LABS (all labs ordered are listed, but only abnormal results are displayed)  Labs Reviewed  URINALYSIS COMPLETEWITH MICROSCOPIC ([facility] ONLY) - Abnormal; Notable for the following:    Color, Urine STRAW (*)    Hgb urine dipstick 1+ (*)    Leukocytes, UA TRACE (*)    Bacteria, UA RARE (*)    Squamous Epithelial / LPF 6-30 (*)    All other components within normal limits  GLUCOSE, CAPILLARY - Abnormal; Notable for the following:    Glucose-Capillary 156 (*)    All other components within normal limits  URINE CULTURE  CBG MONITORING, ED     INITIAL IMPRESSION / ASSESSMENT AND PLAN / ED COURSE  Pertinent labs & imaging results that were available during my care of the patient were reviewed by me and considered in my medical decision making (see chart for details).  Very well-appearing patient. No acute distress. Presents for complaints of 1 day history of urinary frequency and urinary urgency as well as suprapubic pressure. Denies abdominal pain. Denies fevers, nausea, vomiting.  Patient does report that overall she feels rundown. Abdomen soft and nontender. Lungs clear throughout. Blood glucose 156, patient 1.5 hours ago, ate a sandwich. Again very well-appearing patient. Urinalysis positive for rare bacteria, trace leukocytes, 1+ hemoglobin and straw color, will culture. Suspect urinary tract infection. Patient denies known triggers for urinary tract infections. Will treat patient with oral Bactrim , encouraged rest, fluid intake. Encouraged PCP follow-up within the next week to ensure clearance.   Discussed follow up with Primary care physician this week. Discussed follow up and return parameters including no resolution or any worsening concerns. Patient verbalized understanding and agreed to plan.   ____________________________________________   FINAL CLINICAL IMPRESSION(S) / ED DIAGNOSES  Final diagnoses:  UTI (lower urinary tract infection)     Renford DillsLindsey Desi Carby, NP 11/30/15 2210

## 2015-12-04 LAB — URINE CULTURE: Culture: 50000

## 2016-01-29 ENCOUNTER — Ambulatory Visit
Admission: EM | Admit: 2016-01-29 | Discharge: 2016-01-29 | Disposition: A | Discharge status: Home / Self Care | Payer: BLUE CROSS/BLUE SHIELD | Attending: Family Medicine | Admitting: Family Medicine

## 2016-01-29 DIAGNOSIS — N390 Urinary tract infection, site not specified: Secondary | ICD-10-CM

## 2016-01-29 DIAGNOSIS — B3749 Other urogenital candidiasis: Secondary | ICD-10-CM | POA: Diagnosis not present

## 2016-01-29 LAB — URINALYSIS COMPLETE WITH MICROSCOPIC (ARMC ONLY)
Bilirubin Urine: NEGATIVE
Glucose, UA: NEGATIVE mg/dL
Ketones, ur: NEGATIVE mg/dL
Nitrite: NEGATIVE
Protein, ur: NEGATIVE mg/dL
Specific Gravity, Urine: 1.015 (ref 1.005–1.030)
pH: 5 (ref 5.0–8.0)

## 2016-01-29 MED ORDER — CIPROFLOXACIN HCL 500 MG PO TABS: 500.0000 mg | ORAL_TABLET | Freq: Two times a day (BID) | ORAL | Status: DC

## 2016-01-29 MED ORDER — FLUCONAZOLE 150 MG PO TABS: 150.0000 mg | ORAL_TABLET | Freq: Once | ORAL | Status: DC

## 2016-01-29 MED ORDER — PHENAZOPYRIDINE HCL 200 MG PO TABS: 200.0000 mg | ORAL_TABLET | Freq: Three times a day (TID) | ORAL | Status: DC | PRN

## 2016-01-29 NOTE — ED Notes (Addendum)
Patient was here on 11/30/2015 in our office for urinary frequency and light-headedness.  She was diagnosed with a UTI this past January. Patient comes in today c/o right-sided pain which radiates from front to her lower back.  Also complaining of urinary frequency as well.  Her symptoms started at 5:30pm yesterday.

## 2016-01-29 NOTE — ED Provider Notes (Signed)
CSN: [id number]     Arrival date & time 01/29/16  0830 History   First MD Initiated Contact with Patient 01/29/16 [phone]    Nurses notes were reviewed. Chief Complaint  Patient presents with  . Urinary Frequency  . Flank Pain    Right Side   She is here because of urinary frequency and flank pain. She reports pain in the right side of flank and lower abdomen. This will be the third UTI in about a years time after not having A Number of Years. She States That She Was Also Concerned about Kidney Stone Because of the Pain in the Right Flank Area in Further Discussion with Patient Is Some Hygienic Things She Might Need to Do Different like Voiding before coitus. Discussed with the patient the pathophysiology of what causes UTI once her bladder does not empty before relations and suggest that she start implementing back in her regimen. She denies ever having yeast when she is on antibiotics or yeast vaginitis the past she has used Pyridium or equivalent before that seemed to help.    (Consider location/radiation/quality/duration/timing/severity/associated sxs/prior Treatment) Patient is a 40 y.o. female presenting with frequency and flank pain. The history is provided by the patient. No language interpreter was used.  Urinary Frequency This is a new problem. The current episode started yesterday. The problem occurs constantly. The problem has been gradually worsening. Associated symptoms include abdominal pain. Pertinent negatives include no chest pain, no headaches and no shortness of breath. Nothing aggravates the symptoms. Nothing relieves the symptoms. She has tried nothing for the symptoms. The treatment provided no relief.  Flank Pain Associated symptoms include abdominal pain. Pertinent negatives include no chest pain, no headaches and no shortness of breath.    Past Medical History  Diagnosis Date  . Allergic rhinitis   . Anemia, iron deficiency   . Hyperlipidemia   . Obesity   .  Miscarriage 2008  . Ganglion cyst   . Abnormal liver function   . Dysmenorrhea   . Cholelithiasis   . Conjunctivitis   . Hemorrhoids    Past Surgical History  Procedure Laterality Date  . Wrist surgery      Cyst  . Cholecystectomy  2007   Family History  Problem Relation Age of Onset  . Diabetes Father   . Colon cancer Mother     died at 7860  . Colon polyps Maternal Grandmother     benign but still had colectomy  . Heart disease Father   . Heart disease Paternal Grandfather    Social History  Substance Use Topics  . Smoking status: Former Smoker    Quit date: 11/17/2004  . Smokeless tobacco: Never Used  . Alcohol Use: No   OB History    No data available     Review of Systems  Respiratory: Negative for shortness of breath.   Cardiovascular: Negative for chest pain.  Gastrointestinal: Positive for abdominal pain.  Genitourinary: Positive for frequency and flank pain.  Neurological: Negative for headaches.  All other systems reviewed and are negative.   Allergies  Betadine and Neomycin-bacitracin zn-polymyx  Home Medications   Prior to Admission medications   Medication Sig Start Date End Date Taking? Authorizing Provider  amLODipine-benazepril (LOTREL) 10-20 MG per capsule Take 1 capsule by mouth daily.    Historical Provider, MD  cetirizine (ZYRTEC) 10 MG tablet Take 10 mg by mouth daily.    Historical Provider, MD  ciprofloxacin (CIPRO) 500 MG tablet Take 1 tablet (500  mg total) by mouth 2 (two) times daily. 01/29/16   Hassan Rowan, MD  fluconazole (DIFLUCAN) 150 MG tablet Take 1 tablet (150 mg total) by mouth once. 01/29/16   Hassan Rowan, MD  phenazopyridine (PYRIDIUM) 200 MG tablet Take 1 tablet (200 mg total) by mouth 3 (three) times daily as needed for pain. 01/29/16   Hassan Rowan, MD   Meds Ordered and Administered this Visit  Medications - No data to display  BP 144/80 mmHg  Pulse 80  Temp(Src) 98.5 F (36.9 C) (Oral)  Resp 18  Ht  (1.727 m)   Wt 245 lb (111.131 kg)  BMI 37.26 kg/m2  SpO2 100%  LMP 01/15/2016 No data found.   Physical Exam  Constitutional: She is oriented to person, place, and time. She appears well-developed and well-nourished.  HENT:  Head: Normocephalic and atraumatic.  Eyes: Conjunctivae are normal. Pupils are equal, round, and reactive to light.  Neck: Neck supple.  Abdominal: There is no tenderness. There is no CVA tenderness.  Neurological: She is alert and oriented to person, place, and time.  Skin: Skin is warm and dry.  Psychiatric: She has a normal mood and affect.  Vitals reviewed.   ED Course  Procedures (including critical care time)  Labs Review Labs Reviewed  URINALYSIS COMPLETEWITH MICROSCOPIC ([facility] ONLY) - Abnormal; Notable for the following:    Color, Urine STRAW (*)    APPearance HAZY (*)    Hgb urine dipstick 1+ (*)    Leukocytes, UA 2+ (*)    Bacteria, UA FEW (*)    Squamous Epithelial / LPF 6-30 (*)    All other components within normal limits  URINE CULTURE  .  Imaging Review No results found.   Visual Acuity Review  Right Eye Distance:   Left Eye Distance:   Bilateral Distance:    Right Eye Near:   Left Eye Near:    Bilateral Near:     Results for orders placed or performed during the hospital encounter of 01/29/16  Urinalysis complete, with microscopic  Result Value Ref Range   Color, Urine STRAW (A) YELLOW   APPearance HAZY (A) CLEAR   Glucose, UA NEGATIVE NEGATIVE mg/dL   Bilirubin Urine NEGATIVE NEGATIVE   Ketones, ur NEGATIVE NEGATIVE mg/dL   Specific Gravity, Urine 1.015 1.005 - 1.030   Hgb urine dipstick 1+ (A) NEGATIVE   pH 5.0 5.0 - 8.0   Protein, ur NEGATIVE NEGATIVE mg/dL   Nitrite NEGATIVE NEGATIVE   Leukocytes, UA 2+ (A) NEGATIVE   RBC / HPF 0-5 0 - 5 RBC/hpf   WBC, UA 6-30 0 - 5 WBC/hpf   Bacteria, UA FEW (A) NONE SEEN   Squamous Epithelial / LPF 6-30 (A) NONE SEEN   Budding Yeast PRESENT       MDM   1. UTI (lower urinary  tract infection)   2. Yeast UTI    Patient because of yeast present in the urine replacement Diflucan No. 2 and instructions. Cipro 500 one tablet twice a day for 7 days and Pyridium 1 tablet 3 times a day for 3-5 days as needed. Follow-up PCP 1-2 weeks. Notify for any further problems also suggested following hygienic practices of emptying bladder before coitus.  Hassan Rowan, MD 01/29/16 [phone]

## 2016-01-29 NOTE — Discharge Instructions (Signed)

## 2016-01-30 ENCOUNTER — Telehealth: Payer: Self-pay | Admitting: Emergency Medicine

## 2016-02-01 ENCOUNTER — Ambulatory Visit
Admission: EM | Admit: 2016-02-01 | Discharge: 2016-02-01 | Disposition: A | Discharge status: Home / Self Care | Payer: BLUE CROSS/BLUE SHIELD | Attending: Family Medicine | Admitting: Family Medicine

## 2016-02-01 ENCOUNTER — Encounter: Payer: Self-pay | Admitting: Emergency Medicine

## 2016-02-01 DIAGNOSIS — B3749 Other urogenital candidiasis: Secondary | ICD-10-CM | POA: Diagnosis not present

## 2016-02-01 DIAGNOSIS — N390 Urinary tract infection, site not specified: Secondary | ICD-10-CM | POA: Diagnosis not present

## 2016-02-01 LAB — URINALYSIS COMPLETE WITH MICROSCOPIC (ARMC ONLY)
Bilirubin Urine: NEGATIVE
Glucose, UA: NEGATIVE mg/dL
Ketones, ur: NEGATIVE mg/dL
Nitrite: NEGATIVE
Protein, ur: NEGATIVE mg/dL
Specific Gravity, Urine: 1.03 (ref 1.005–1.030)
pH: 5.5 (ref 5.0–8.0)

## 2016-02-01 LAB — URINE CULTURE

## 2016-02-01 MED ORDER — CLOTRIMAZOLE 2 % VA CREA: 1.0000 | TOPICAL_CREAM | Freq: Every day | VAGINAL | Status: DC

## 2016-02-01 MED ORDER — FLUCONAZOLE 150 MG PO TABS: 150.0000 mg | ORAL_TABLET | Freq: Every day | ORAL | Status: DC

## 2016-02-01 NOTE — Discharge Instructions (Signed)
Take medication as prescribed. Rest. Drink plenty of fluids. Wear cotton underwear, and change as discussed. Continue Bactrim.     Follow up with your primary care physician or your gynecologist this week.Follow up is important.  Return to Urgent care or ER for abdominal pain, fevers, back pain, new or worsening concerns.      Urinary Tract Infection A urinary tract infection (UTI) can occur any place along the urinary tract. The tract includes the kidneys, ureters, bladder, and urethra. A type of germ called bacteria often causes a UTI. UTIs are often helped with antibiotic medicine.  HOME CARE   If given, take antibiotics as told by your doctor. Finish them even if you start to feel better.  Drink enough fluids to keep your pee (urine) clear or pale yellow.  Avoid tea, drinks with caffeine, and bubbly (carbonated) drinks.  Pee often. Avoid holding your pee in for a long time.  Pee before and after having sex (intercourse).  Wipe from front to back after you poop (bowel movement) if you are a woman. Use each tissue only once. GET HELP RIGHT AWAY IF:   You have back pain.  You have lower belly (abdominal) pain.  You have chills.  You feel sick to your stomach (nauseous).  You throw up (vomit).  Your burning or discomfort with peeing does not go away.  You have a fever.  Your symptoms are not better in 3 days. MAKE SURE YOU:   Understand these instructions.  Will watch your condition.  Will get help right away if you are not doing well or get worse.   This information is not intended to replace advice given to you by your health care provider. Make sure you discuss any questions you have with your health care provider.   Document Released: 04/21/2008 Document Revised: 11/24/2014 Document Reviewed: 06/03/2012 Elsevier Interactive Patient Education [employer]2016 [employer] Inc.

## 2016-02-01 NOTE — ED Notes (Signed)
Patient states that her symptoms have not improved since taking Cipro and Bactrim.  Patient has only had three doses of Bactrim.  Patient denies fevers.

## 2016-02-01 NOTE — ED Provider Notes (Signed)
[facility]  ____________________________________________  Time seen: Approximately 9:18 AM  I have reviewed the triage vital signs and the nursing notes.   HISTORY  Chief Complaint Dysuria   HPI Deanna Potts is a 40 y.o. female  presents for the complaints of continued urinary frequency, urinary urgency and burning with urination. Patient reports she was seen in urgent care 3 days ago for the same symptoms. Patient states that that time she was diagnosed with urinary tract infection started on oral Cipro. Patient reports that she took 3 doses of Cipro and states that evening she felt very flushed, called the urgent care and was then changed antibiotics to Bactrim. Patient states that she has tolerated Bactrim well. Patient reports that she has had a total of 3 doses of the oral Bactrim reports has continued with urinary frequency urgency and burning. Patient states last night she still felt that she was urinating every 15 minutes.   Patient denies nausea, vomiting, diarrhea, abdominal pain. Denies any vaginal complaints. Denies back pain. Denies vaginal bleeding, vaginal discharge, vaginal odor, concerns for sexually transmitted diseases. Patient reports she had a urinary tract infection approximately 2-3 months ago. States prior to that she had not had a urinary tract infection for at least 10-15 years.  Patient reports in the last 2-3 months the only recent changes that she is now in [employer] school. Patient states that she has recently had to hold her urine for longer periods of time than normal as well as she does not drink as much fluids as she normally would. Patient also reports that while she is at school she is in a very hot environment and is recently sweating Denies fevers. Patient reports has taken Bactrim in the past and has tolerated it well.  Denies chest pain, shortness of breath, dizziness, abdominal pain, neck pain, back pain, rash, dysuria or  weakness.  Patient's last menstrual period was 01/18/2016 (approximate).Denies chance of pregnancy.     Past Medical History  Diagnosis Date  . Allergic rhinitis   . Anemia, iron deficiency   . Hyperlipidemia   . Obesity   . Miscarriage 2008  . Ganglion cyst   . Abnormal liver function   . Dysmenorrhea   . Cholelithiasis   . Conjunctivitis   . Hemorrhoids     Patient Active Problem List   Diagnosis Date Noted  . Family history of colon cancer in mother 08/06/2015  . UTI 10/17/2010  . GANGLION CYST 02/12/2010  . HYPERLIPIDEMIA 08/16/2007  . ALLERGIC RHINITIS 08/16/2007  . CHOLELITHIASIS 08/16/2007  . DYSMENORRHEA 08/16/2007  . LIVER FUNCTION TESTS, ABNORMAL 08/16/2007    Past Surgical History  Procedure Laterality Date  . Wrist surgery      Cyst  . Cholecystectomy  2007    Current Outpatient Rx  Name  Route  Sig  Dispense  Refill  . amLODipine-benazepril (LOTREL) 10-20 MG per capsule   Oral   Take 1 capsule by mouth daily.         . cetirizine (ZYRTEC) 10 MG tablet   Oral   Take 10 mg by mouth daily.         . Bactrim          .           Marland Kitchen.             Allergies Betadine and Neomycin-bacitracin zn-polymyx  Family History  Problem Relation Age of Onset  . Diabetes Father   . Colon cancer  Mother     died at 29  . Colon polyps Maternal Grandmother     benign but still had colectomy  . Heart disease Father   . Heart disease Paternal Grandfather     Social History Social History  Substance Use Topics  . Smoking status: Former Smoker    Quit date: 11/17/2004  . Smokeless tobacco: Never Used  . Alcohol Use: No    Review of Systems Constitutional: No fever/chills Eyes: No visual changes. ENT: No sore throat. Cardiovascular: Denies chest pain. Respiratory: Denies shortness of breath. Gastrointestinal: No abdominal pain.  No nausea, no vomiting.  No diarrhea.  No constipation. Genitourinary: Positive for dysuria. Musculoskeletal:  Negative for back pain. Skin: Negative for rash. Neurological: Negative for headaches, focal weakness or numbness.  10-point ROS otherwise negative.  ____________________________________________   PHYSICAL EXAM:  VITAL SIGNS: ED Triage Vitals  Enc Vitals Group     BP 02/01/16 0901 153/80 mmHg     Pulse Rate 02/01/16 0901 93     Resp 02/01/16 0901 16     Temp 02/01/16 0901 98.6 F (37 C)     Temp Source 02/01/16 0901 Tympanic     SpO2 02/01/16 0901 100 %     Weight 02/01/16 0901 245 lb (111.131 kg)     Height 02/01/16 0901  (1.727 m)     Head Cir --      Peak Flow --      Pain Score 02/01/16 0904 2     Pain Loc --      Pain Edu? --      Excl. in GC? --     Constitutional: Alert and oriented. Well appearing and in no acute distress. Eyes: Conjunctivae are normal. PERRL. EOMI. Head: Atraumatic.  Nose: No congestion/rhinnorhea.  Mouth/Throat: Mucous membranes are moist.  Oropharynx non-erythematous. Neck: No stridor.  No cervical spine tenderness to palpation. Hematological/Lymphatic/Immunilogical: No cervical lymphadenopathy. Cardiovascular: Normal rate, regular rhythm. Grossly normal heart sounds.  Good peripheral circulation. Respiratory: Normal respiratory effort.  No retractions. Lungs CTAB. Gastrointestinal: Soft and nontender.Obese abdomen. Bowel sounds.  No abdominal bruits. No CVA tenderness. Musculoskeletal: No lower or upper extremity tenderness nor edema. No cervical, thoracic or lumbar tenderness to palpation. Bilateral pedal pulses equal and easily palpated.  Neurologic:  Normal speech and language. No gross focal neurologic deficits are appreciated. No gait instability. Skin:  Skin is warm, dry and intact. No rash noted. Psychiatric: Mood and affect are normal. Speech and behavior are normal.  ____________________________________________   LABS (all labs ordered are listed, but only abnormal results are displayed)  Labs Reviewed  URINALYSIS  COMPLETEWITH MICROSCOPIC ([facility] ONLY) - Abnormal; Notable for the following:    APPearance CLOUDY (*)    Hgb urine dipstick 1+ (*)    Leukocytes, UA TRACE (*)    Bacteria, UA FEW (*)    Squamous Epithelial / LPF 6-30 (*)    All other components within normal limits  URINE CULTURE  PREGNANCY, URINE   INITIAL IMPRESSION / ASSESSMENT AND PLAN / ED COURSE  Pertinent labs & imaging results that were available during my care of the patient were reviewed by me and considered in my medical decision making (see chart for details).  Very well-appearing patient. No acute distress. Dysuria but has continued as above. Patient urine culture from 3 days ago inconclusive with multiple species present. However urine culture present from 2 months ago. In reviewing urine culture from that time urine culture showed staph aureus with  resistance to Cipro, but sensitive to other antibiotics. As patient was started on Cipro initially for this current urinary tract infection, concern that patient remains resistant to Cipro. Discussed this in detail with patient. As patient is tolerating Bactrim well per her, and has only had 3 doses of Bactrim will continue the Bactrim. Patient continues with budding yeast on urinalysis. Will treat patient with oral Diflucan as well as clotrimazole vaginal cream. Encouraged patient to void when needed, void post intercourse as well as wear cotton underwear and change her underwear mid shift at school due to what she describes as sweating so frequently, and . No glucose in urine.  Discussed performing pelvic exam and KUB for further evaluation, patient deferred at this time.  Counseled patient to follow-up very closely with primary care physician. Patient states that she often follows with her gynecologist for these issues and states that she will follow-up in one week with gynecology.    Discussed follow up with Primary care physician this week. Discussed follow up and return  urgent  care or ER parameters including  abdominal pain, back pain, fever, inability to tolerate food or medication,no resolution or any worsening concerns. Patient verbalized understanding and agreed to plan.   ____________________________________________   FINAL CLINICAL IMPRESSION(S) / ED DIAGNOSES  Final diagnoses:  UTI (lower urinary tract infection)  Yeast UTI      Note: This dictation was prepared with Dragon dictation along with smaller phrase technology. Any transcriptional errors that result from this process are unintentional.    Renford Dills, NP 02/01/16 1022

## 2016-02-03 LAB — URINE CULTURE

## 2016-02-08 ENCOUNTER — Other Ambulatory Visit: Payer: Self-pay | Admitting: Internal Medicine

## 2016-02-08 ENCOUNTER — Ambulatory Visit (INDEPENDENT_AMBULATORY_CARE_PROVIDER_SITE_OTHER): Payer: BLUE CROSS/BLUE SHIELD | Admitting: Internal Medicine

## 2016-02-08 ENCOUNTER — Encounter: Payer: Self-pay | Admitting: Internal Medicine

## 2016-02-08 VITALS — BP 130/82 | HR 76 | Ht 68.0 in | Wt 246.0 lb

## 2016-02-08 DIAGNOSIS — N390 Urinary tract infection, site not specified: Secondary | ICD-10-CM

## 2016-02-08 DIAGNOSIS — I10 Essential (primary) hypertension: Secondary | ICD-10-CM | POA: Insufficient documentation

## 2016-02-08 DIAGNOSIS — Z23 Encounter for immunization: Secondary | ICD-10-CM

## 2016-02-08 DIAGNOSIS — J3089 Other allergic rhinitis: Secondary | ICD-10-CM

## 2016-02-08 NOTE — Patient Instructions (Signed)
Tdap Vaccine (Tetanus, Diphtheria and Pertussis): What You Need to Know 1. Why get vaccinated? Tetanus, diphtheria and pertussis are very serious diseases. Tdap vaccine can protect us from these diseases. And, Tdap vaccine given to pregnant women can protect newborn babies against pertussis. TETANUS (Lockjaw) is rare in the [place] today. It causes painful muscle tightening and stiffness, usually all over the body.  It can lead to tightening of muscles in the head and neck so you can't open your mouth, swallow, or sometimes even breathe. Tetanus kills about 1 out of 10 people who are infected even after receiving the best medical care. DIPHTHERIA is also rare in the [place] today. It can cause a thick coating to form in the back of the throat.  It can lead to breathing problems, heart failure, paralysis, and death. PERTUSSIS (Whooping Cough) causes severe coughing spells, which can cause difficulty breathing, vomiting and disturbed sleep.  It can also lead to weight loss, incontinence, and rib fractures. Up to 2 in 100 adolescents and 5 in 100 adults with pertussis are hospitalized or have complications, which could include pneumonia or death. These diseases are caused by bacteria. Diphtheria and pertussis are spread from person to person through secretions from coughing or sneezing. Tetanus enters the body through cuts, scratches, or wounds. Before vaccines, as many as 200,000 cases of diphtheria, 200,000 cases of pertussis, and hundreds of cases of tetanus, were reported in the [place] each year. Since vaccination began, reports of cases for tetanus and diphtheria have dropped by about 99% and for pertussis by about 80%. 2. Tdap vaccine Tdap vaccine can protect adolescents and adults from tetanus, diphtheria, and pertussis. One dose of Tdap is routinely given at age 11 or 12. People who did not get Tdap at that age should get it as soon as possible. Tdap is especially important  for healthcare professionals and anyone having close contact with a baby younger than 12 months. Pregnant women should get a dose of Tdap during every pregnancy, to protect the newborn from pertussis. Infants are most at risk for severe, life-threatening complications from pertussis. Another vaccine, called Td, protects against tetanus and diphtheria, but not pertussis. A Td booster should be given every 10 years. Tdap may be given as one of these boosters if you have never gotten Tdap before. Tdap may also be given after a severe cut or burn to prevent tetanus infection. Your doctor or the person giving you the vaccine can give you more information. Tdap may safely be given at the same time as other vaccines. 3. Some people should not get this vaccine  A person who has ever had a life-threatening allergic reaction after a previous dose of any diphtheria, tetanus or pertussis containing vaccine, OR has a severe allergy to any part of this vaccine, should not get Tdap vaccine. Tell the person giving the vaccine about any severe allergies.  Anyone who had coma or long repeated seizures within 7 days after a childhood dose of DTP or DTaP, or a previous dose of Tdap, should not get Tdap, unless a cause other than the vaccine was found. They can still get Td.  Talk to your doctor if you:  have seizures or another nervous system problem,  had severe pain or swelling after any vaccine containing diphtheria, tetanus or pertussis,  ever had a condition called Guillain-Barr Syndrome (GBS),  aren't feeling well on the day the shot is scheduled. 4. Risks With any medicine, including vaccines, there is   a chance of side effects. These are usually mild and go away on their own. Serious reactions are also possible but are rare. Most people who get Tdap vaccine do not have any problems with it. Mild problems following Tdap (Did not interfere with activities)  Pain where the shot was given (about 3 in 4  adolescents or 2 in 3 adults)  Redness or swelling where the shot was given (about 1 person in 5)  Mild fever of at least 100.4F (up to about 1 in 25 adolescents or 1 in 100 adults)  Headache (about 3 or 4 people in 10)  Tiredness (about 1 person in 3 or 4)  Nausea, vomiting, diarrhea, stomach ache (up to 1 in 4 adolescents or 1 in 10 adults)  Chills, sore joints (about 1 person in 10)  Body aches (about 1 person in 3 or 4)  Rash, swollen glands (uncommon) Moderate problems following Tdap (Interfered with activities, but did not require medical attention)  Pain where the shot was given (up to 1 in 5 or 6)  Redness or swelling where the shot was given (up to about 1 in 16 adolescents or 1 in 12 adults)  Fever over 102F (about 1 in 100 adolescents or 1 in 250 adults)  Headache (about 1 in 7 adolescents or 1 in 10 adults)  Nausea, vomiting, diarrhea, stomach ache (up to 1 or 3 people in 100)  Swelling of the entire arm where the shot was given (up to about 1 in 500). Severe problems following Tdap (Unable to perform usual activities; required medical attention)  Swelling, severe pain, bleeding and redness in the arm where the shot was given (rare). Problems that could happen after any vaccine:  People sometimes faint after a medical procedure, including vaccination. Sitting or lying down for about 15 minutes can help prevent fainting, and injuries caused by a fall. Tell your doctor if you feel dizzy, or have vision changes or ringing in the ears.  Some people get severe pain in the shoulder and have difficulty moving the arm where a shot was given. This happens very rarely.  Any medication can cause a severe allergic reaction. Such reactions from a vaccine are very rare, estimated at fewer than 1 in a million doses, and would happen within a few minutes to a few hours after the vaccination. As with any medicine, there is a very remote chance of a vaccine causing a serious  injury or death. The safety of vaccines is always being monitored. For more information, visit: www.cdc.gov/vaccinesafety/ 5. What if there is a serious problem? What should I look for?  Look for anything that concerns you, such as signs of a severe allergic reaction, very high fever, or unusual behavior.  Signs of a severe allergic reaction can include hives, swelling of the face and throat, difficulty breathing, a fast heartbeat, dizziness, and weakness. These would usually start a few minutes to a few hours after the vaccination. What should I do?  If you think it is a severe allergic reaction or other emergency that can't wait, call 9-1-1 or get the person to the nearest hospital. Otherwise, call your doctor.  Afterward, the reaction should be reported to the Vaccine Adverse Event Reporting System (VAERS). Your doctor might file this report, or you can do it yourself through the VAERS web site at www.vaers.hhs.gov, or by calling 1-800-822-7967. VAERS does not give medical advice.  6. The National Vaccine Injury Compensation Program The National Vaccine Injury Compensation Program (  VICP) is a federal program that was created to compensate people who may have been injured by certain vaccines. Persons who believe they may have been injured by a vaccine can learn about the program and about filing a claim by calling 1-800-338-2382 or visiting the VICP website at www.hrsa.gov/vaccinecompensation. There is a time limit to file a claim for compensation. 7. How can I learn more?  Ask your doctor. He or she can give you the vaccine package insert or suggest other sources of information.  Call your local or state health department.  Contact the Centers for Disease Control and Prevention (CDC):  Call 1-800-232-4636 (1-800-CDC-INFO) or  Visit CDC's website at www.cdc.gov/vaccines CDC Tdap Vaccine VIS (01/10/14)   This information is not intended to replace advice given to you by your health care  provider. Make sure you discuss any questions you have with your health care provider.   Document Released: 05/04/2012 Document Revised: 11/24/2014 Document Reviewed: 02/15/2014 Elsevier Interactive Patient Education 2016 [employer] Inc.  

## 2016-02-08 NOTE — Progress Notes (Signed)
Date:  02/08/2016   Name:  Deanna Potts   DOB:  [birth year]   MRN:  [id number]  New patient here to establish care.  Previously seen at [facility]. She continues to see her GYN in [place] for mammograms, Pap and pelvic exams yearly.  Chief Complaint: New Evaluation Hypertension This is a chronic problem. The current episode started more than 1 year ago. The problem is unchanged. The problem is controlled (135/80). Pertinent negatives include no chest pain, headaches, palpitations or shortness of breath. Past treatments include calcium channel blockers. The current treatment provides significant improvement.  Urinary Tract Infection  This is a recurrent problem. The current episode started in the past 7 days. The problem occurs every urination. The problem has been gradually improving. The quality of the pain is described as burning. Pertinent negatives include no chills, hematuria or urgency. She has tried antibiotics for the symptoms. The treatment provided moderate relief.  She had one UTI last year and 2 so far this year.  She is in school and doesn't drink as much fluid as she should and has limited opportunities to void. Wound - yesterday she cut her index finger on a rusty mop handle.  She had cleaned it and used liquid bandaid.  It is tender but not inflamed, red or draining/bleeding. Allergies - general seasonal allergies.  She takes Zyrtec as needed.  She denies asthma or severe shortness of breath.  Review of Systems  Constitutional: Negative for fever, chills and fatigue.  HENT: Negative for tinnitus and trouble swallowing.   Eyes: Negative for visual disturbance.  Respiratory: Negative for chest tightness, shortness of breath and wheezing.   Cardiovascular: Negative for chest pain, palpitations and leg swelling.  Gastrointestinal: Negative for abdominal pain and blood in stool.  Genitourinary: Negative for urgency, hematuria and pelvic pain.  Skin: Positive for  wound.  Neurological: Negative for dizziness and headaches.  Psychiatric/Behavioral: Negative for sleep disturbance.    Patient Active Problem List   Diagnosis Date Noted  . Family history of colon cancer in mother 08/06/2015  . UTI 10/17/2010  . GANGLION CYST 02/12/2010  . HYPERLIPIDEMIA 08/16/2007  . ALLERGIC RHINITIS 08/16/2007  . CHOLELITHIASIS 08/16/2007  . Dysmenorrhea 08/16/2007  . LIVER FUNCTION TESTS, ABNORMAL 08/16/2007    Prior to Admission medications   Medication Sig Start Date End Date Taking? Authorizing Provider  amLODipine (NORVASC) 10 MG tablet Take 10 mg by mouth daily. 01/18/16  Yes Historical Provider, MD  cetirizine (ZYRTEC) 10 MG tablet Take 10 mg by mouth daily.   Yes Historical Provider, MD    Allergies  Allergen Reactions  . Betadine [Povidone Iodine]   . Neomycin-Bacitracin Zn-Polymyx     REACTION: ? reaction    Past Surgical History  Procedure Laterality Date  . Ganglion cyst excision      Cyst  . Cholecystectomy  2007  . Colonoscopy  07/2015    Social History  Substance Use Topics  . Smoking status: Former Smoker    Quit date: 11/17/2004  . Smokeless tobacco: Never Used  . Alcohol Use: No     Medication list has been reviewed and updated.   Physical Exam  Constitutional: She is oriented to person, place, and time. She appears well-developed and well-nourished. No distress.  HENT:  Head: Normocephalic and atraumatic.  Neck: Normal range of motion. Neck supple. No thyromegaly present.  Cardiovascular: Normal rate, regular rhythm and normal heart sounds.   Pulmonary/Chest: Effort normal and breath sounds normal. No  respiratory distress.  Abdominal: Soft. Bowel sounds are normal. She exhibits no mass. There is no tenderness. There is no rebound, no guarding and no CVA tenderness.  Musculoskeletal: Normal range of motion.  Neurological: She is alert and oriented to person, place, and time. She has normal reflexes.  Skin: Skin is warm  and dry. No rash noted.  Small laceration on distal right index finger - no swelling or drainage  Psychiatric: She has a normal mood and affect. Her speech is normal and behavior is normal. Thought content normal.  Nursing note and vitals reviewed.   BP 130/82 mmHg  Pulse 76  Ht 5\' 8"  (1.727 m)  Wt 246 lb (111.585 kg)  BMI 37.41 kg/m2  LMP 01/13/2016  Assessment and Plan: 1. Essential hypertension controlled  2. UTI Culture showed mixed organisms and symptoms improved after Bactrim Discussed need to increase fluids as well as beginning frequent voiding  3. Environmental and seasonal allergies Continue Zyrtec as needed  4. Need for diphtheria-tetanus-pertussis (Tdap) vaccine Continue local wound care - Tdap vaccine greater than or equal to 7yo IM   Bari EdwardLaura Morgon Pamer, MD [facility]  02/08/2016

## 2016-03-21 ENCOUNTER — Encounter: Payer: Self-pay | Admitting: Internal Medicine

## 2016-03-21 ENCOUNTER — Other Ambulatory Visit: Payer: Self-pay | Admitting: Internal Medicine

## 2016-03-21 ENCOUNTER — Ambulatory Visit (INDEPENDENT_AMBULATORY_CARE_PROVIDER_SITE_OTHER): Payer: BLUE CROSS/BLUE SHIELD | Admitting: Internal Medicine

## 2016-03-21 VITALS — BP 142/92 | HR 84 | Temp 98.7°F | Resp 16 | Ht 68.0 in | Wt 247.0 lb

## 2016-03-21 DIAGNOSIS — N390 Urinary tract infection, site not specified: Secondary | ICD-10-CM

## 2016-03-21 LAB — POC URINALYSIS WITH MICROSCOPIC (NON AUTO)MANUAL RESULT
Bilirubin, UA: NEGATIVE
Crystals: 0
Epithelial cells, urine per micros: 5
Glucose, UA: NEGATIVE
Ketones, UA: NEGATIVE
Mucus, UA: 0
Nitrite, UA: POSITIVE
RBC: 2 M/uL — AB (ref 4.04–5.48)
Spec Grav, UA: 1.02
Urobilinogen, UA: 0.2
WBC Casts, UA: 10
pH, UA: 6

## 2016-03-21 MED ORDER — SULFAMETHOXAZOLE-TRIMETHOPRIM 800-160 MG PO TABS: 1.0000 | ORAL_TABLET | Freq: Two times a day (BID) | ORAL | Status: DC

## 2016-03-21 NOTE — Progress Notes (Signed)
    Date:  03/21/2016   Name:  Deanna Potts   DOB:  1976/08/02   MRN:  [id number]   Chief Complaint: Urinary Tract Infection Urinary Tract Infection  This is a new problem. The current episode started yesterday. The problem occurs every urination. The problem has been unchanged. The quality of the pain is described as burning. There has been no fever. Associated symptoms include flank pain, frequency and urgency. Pertinent negatives include no chills, hematuria, nausea or vomiting. Her past medical history is significant for recurrent UTIs. Jan and Mar 2017  Last treated with Bactrim.  Previous 2 cultures showed mixed flora.   Review of Systems  Constitutional: Negative for fever, chills and fatigue.  Respiratory: Negative for cough and shortness of breath.   Cardiovascular: Negative for chest pain and palpitations.  Gastrointestinal: Negative for nausea and vomiting.  Genitourinary: Positive for dysuria, urgency, frequency and flank pain. Negative for hematuria.  Musculoskeletal: Positive for back pain.  Neurological: Negative for dizziness.    Patient Active Problem List   Diagnosis Date Noted  . Environmental and seasonal allergies 02/08/2016  . Essential hypertension 02/08/2016  . Family history of colon cancer in mother 08/06/2015  . UTI 10/17/2010  . HYPERLIPIDEMIA 08/16/2007  . ALLERGIC RHINITIS 08/16/2007  . Dysmenorrhea 08/16/2007  . LIVER FUNCTION TESTS, ABNORMAL 08/16/2007    Prior to Admission medications   Medication Sig Start Date End Date Taking? Authorizing Provider  amLODipine (NORVASC) 10 MG tablet Take 10 mg by mouth daily. 01/18/16  Yes Historical Provider, MD  cetirizine (ZYRTEC) 10 MG tablet Take 10 mg by mouth daily.   Yes Historical Provider, MD    Allergies  Allergen Reactions  . Betadine [Povidone Iodine]   . Ciprofloxacin Hives  . Neomycin-Bacitracin Zn-Polymyx     REACTION: ? reaction    Past Surgical History  Procedure Laterality Date  .  Ganglion cyst excision      Cyst  . Cholecystectomy  2007  . Colonoscopy  07/2015    Social History  Substance Use Topics  . Smoking status: Former Smoker    Quit date: 11/17/2004  . Smokeless tobacco: Never Used  . Alcohol Use: No    Medication list has been reviewed and updated.   Physical Exam  Constitutional: She appears well-developed and well-nourished.  Cardiovascular: Normal rate, regular rhythm and normal heart sounds.   Pulmonary/Chest: Effort normal and breath sounds normal.  Abdominal: Soft. Normal appearance. Bowel sounds are decreased. There is no hepatosplenomegaly. There is no tenderness. There is no CVA tenderness.  Psychiatric: She has a normal mood and affect.  Nursing note and vitals reviewed.   BP 142/92 mmHg  Pulse 84  Temp(Src) 98.7 F (37.1 C)  Resp 16  Ht 5\' 8"  (1.727 m)  Wt 247 lb (112.038 kg)  BMI 37.56 kg/m2  SpO2 99%  LMP 03/07/2016  Assessment and Plan: 1. UTI Will begin therapy and change if needed when culture returns Will refer to Urology for recurrent symptoms - POC urinalysis w microscopic (non auto) - Urine Culture - sulfamethoxazole-trimethoprim (BACTRIM DS,SEPTRA DS) 800-160 MG tablet; Take 1 tablet by mouth 2 (two) times daily.  Dispense: 20 tablet; Refill: 0 - Ambulatory referral to Urology   Bari EdwardLaura Berglund, MD [facility]  03/21/2016

## 2016-03-25 LAB — URINE CULTURE

## 2016-04-03 ENCOUNTER — Ambulatory Visit (INDEPENDENT_AMBULATORY_CARE_PROVIDER_SITE_OTHER): Payer: BLUE CROSS/BLUE SHIELD | Admitting: Urology

## 2016-04-03 ENCOUNTER — Encounter: Payer: Self-pay | Admitting: Urology

## 2016-04-03 VITALS — BP 148/84 | HR 85 | Ht 68.0 in | Wt 248.2 lb

## 2016-04-03 DIAGNOSIS — R3129 Other microscopic hematuria: Secondary | ICD-10-CM

## 2016-04-03 DIAGNOSIS — R300 Dysuria: Secondary | ICD-10-CM

## 2016-04-03 DIAGNOSIS — N390 Urinary tract infection, site not specified: Secondary | ICD-10-CM | POA: Diagnosis not present

## 2016-04-03 LAB — URINALYSIS, COMPLETE
Bilirubin, UA: NEGATIVE
Glucose, UA: NEGATIVE
Ketones, UA: NEGATIVE
Nitrite, UA: NEGATIVE
Protein, UA: NEGATIVE
Specific Gravity, UA: 1.01 (ref 1.005–1.030)
Urobilinogen, Ur: 0.2 mg/dL (ref 0.2–1.0)
pH, UA: 5 (ref 5.0–7.5)

## 2016-04-03 LAB — MICROSCOPIC EXAMINATION

## 2016-04-03 LAB — BLADDER SCAN AMB NON-IMAGING: Scan Result: 18

## 2016-04-03 NOTE — Progress Notes (Signed)
04/03/2016 9:34 AM   Deanna Potts Deanna Potts 26-Jul-1976 [id number]  Referring provider: Reubin Milan, MD [address] [place], [place] [address]  Chief Complaint  Patient presents with  . Urinary Tract Infection    referred by Dr. Asencion Partridge    HPI: Patient is a 40 year old Caucasian who presents today as a referral from their PCP, Dr. Judithann Graves, for recurrent UTI's.    Patient was found to have microscopic hematuria on 11/30/2015 with 0-5 RBC's/hpf with a urine culture positive for staph aureus with mixed bacterial organisms.   She was treated with Bactrim.   Patient then had microscopic hematuria on 01/29/2016 with 0-5 rbc's/hpf with urine culture positive for multiple species   Her microscopic UA today is positive for 3-10 rbc's/hpf.  Patient doesn't have a prior history of microscopic hematuria.    She does not have a prior history of recurrent urinary tract infections.   She does not have a history of nephrolithiasis, trauma to the genitourinary tract or malignancies of the genitourinary tract.   She does have a family medical history of nephrolithiasis paternal aunt,but no family history of  malignancies of the genitourinary tract or hematuria.   Her symptoms are not typical of a urinary tract infection as she is having pain in the epigastric region and radiates to her upper back bilaterally.   Last year, she stated she had symptoms of heart palpitations and felt queasy and was treated for a urinary tract infection at that time.    Today, she is having symptoms of frequent urination and nocturia, but she is not experiencing urgency, dysuria, incontinence, hesitancy, intermittency, straining to urinate or a weak urinary stream.  Her PVR is 18 mL.    She is  not experiencing any suprapubic pain or flank pain. She denies any recent fevers, chills, nausea or vomiting.   She has not had any recent imaging studies.   She is a former smoker with a 1 ppd history.  Quit one year  ago.  She is exposed to secondhand smoke.  She has not worked with chemicals.     PMH: Past Medical History  Diagnosis Date  . Allergic rhinitis   . Anemia, iron deficiency   . Hyperlipidemia   . Obesity   . Miscarriage 2008  . Ganglion cyst   . Abnormal liver function   . Dysmenorrhea   . Cholelithiasis   . Conjunctivitis   . Hemorrhoids   . HTN (hypertension)     Surgical History: Past Surgical History  Procedure Laterality Date  . Ganglion cyst excision      Cyst  . Cholecystectomy  2007  . Colonoscopy  07/2015    Home Medications:    Medication List       This list is accurate as of: 04/03/16  9:34 AM.  Always use your most recent med list.               amLODipine 10 MG tablet  Commonly known as:  NORVASC  Take 10 mg by mouth daily.     cetirizine 10 MG tablet  Commonly known as:  ZYRTEC  Take 10 mg by mouth daily.     sulfamethoxazole-trimethoprim 800-160 MG tablet  Commonly known as:  BACTRIM DS,SEPTRA DS  Take 1 tablet by mouth 2 (two) times daily.        Allergies:  Allergies  Allergen Reactions  . Betadine [Povidone Iodine]   . Ciprofloxacin Hives  . Neomycin-Bacitracin Zn-Polymyx  REACTION: ? reaction    Family History: Family History  Problem Relation Age of Onset  . Diabetes Father   . Colon cancer Mother     died at 4760  . Colon polyps Maternal Grandmother     benign but still had colectomy  . Heart disease Father   . Heart disease Paternal Grandfather   . Aneurysm Father   . Kidney disease Neg Hx   . Bladder Cancer Neg Hx     Social History:  reports that she quit smoking about 11 years ago. She has never used smokeless tobacco. She reports that she does not drink alcohol or use illicit drugs.  ROS: UROLOGY Frequent Urination?: Yes Hard to postpone urination?: No Burning/pain with urination?: No Get up at night to urinate?: Yes Leakage of urine?: No Urine stream starts and stops?: No Trouble starting stream?:  No Do you have to strain to urinate?: No Blood in urine?: No Urinary tract infection?: Yes Sexually transmitted disease?: No Injury to kidneys or bladder?: No Painful intercourse?: No Weak stream?: No Currently pregnant?: No Vaginal bleeding?: No Last menstrual period?: [id number]  Gastrointestinal Nausea?: No Vomiting?: No Indigestion/heartburn?: Yes Diarrhea?: No Constipation?: No  Constitutional Fever: No Night sweats?: No Weight loss?: No Fatigue?: No  Skin Skin rash/lesions?: No Itching?: No  Eyes Blurred vision?: No Double vision?: No  Ears/Nose/Throat Sore throat?: No Sinus problems?: Yes  Hematologic/Lymphatic Swollen glands?: No Easy bruising?: No  Cardiovascular Leg swelling?: No Chest pain?: No  Respiratory Cough?: No Shortness of breath?: No  Endocrine Excessive thirst?: No  Musculoskeletal Back pain?: Yes Joint pain?: No  Neurological Headaches?: No Dizziness?: No  Psychologic Depression?: No Anxiety?: No  Physical Exam: BP 148/84 mmHg  Pulse 85  Ht 5\' 8"  (1.727 m)  Wt 248 lb 3.2 oz (112.583 kg)  BMI 37.75 kg/m2  LMP 03/28/2016  Constitutional: Well nourished. Alert and oriented, No acute distress. HEENT: [place] AT, moist mucus membranes. Trachea midline, no masses. Cardiovascular: No clubbing, cyanosis, or edema. Respiratory: Normal respiratory effort, no increased work of breathing. GI: Abdomen is soft, non tender, non distended, no abdominal masses. Liver and spleen not palpable.  No hernias appreciated.  Stool sample for occult testing is not indicated.   GU: No CVA tenderness.  No bladder fullness or masses.  Normal external genitalia, normal pubic hair distribution, no lesions.  Normal urethral meatus, no lesions, no prolapse, no discharge.   No urethral masses, tenderness and/or tenderness. No bladder fullness, tenderness or masses. Normal vagina mucosa, good estrogen effect, no discharge, no lesions, good pelvic support,  Grade II cystocele or rectocele noted.  No cervical motion tenderness.  Uterus is freely mobile and non-fixed.  No adnexal/parametria masses or tenderness noted.  Anus and perineum are without rashes or lesions.    Skin: No rashes, bruises or suspicious lesions. Lymph: No cervical or inguinal adenopathy. Neurologic: Grossly intact, no focal deficits, moving all 4 extremities. Psychiatric: Normal mood and affect.  Laboratory Data: Lab Results  Component Value Date   WBC 16.5* 08/27/2009   HGB 12.3 02/26/2010   HCT 25.3* 08/27/2009   MCV 84.2 08/27/2009   PLT 200 08/27/2009    Lab Results  Component Value Date   CREATININE 0.65 02/21/2010       Component Value Date/Time   CHOL 193 08/26/2007 1249   HDL 28.1* 08/26/2007 1249   CHOLHDL 6.9 CALC 08/26/2007 1249   VLDL 49* 08/26/2007 1249    Lab Results  Component Value Date  AST 41* 08/24/2009   Lab Results  Component Value Date   ALT 17 08/24/2009    Urinalysis Results for orders placed or performed in visit on 04/03/16  Microscopic Examination  Result Value Ref Range   WBC, UA 6-10 (A) 0 -  5 /hpf   RBC, UA 3-10 (A) 0 -  2 /hpf   Epithelial Cells (non renal) 0-10 0 - 10 /hpf   Bacteria, UA Few None seen/Few  Urinalysis, Complete  Result Value Ref Range   Specific Gravity, UA 1.010 1.005 - 1.030   pH, UA 5.0 5.0 - 7.5   Color, UA Yellow Yellow   Appearance Ur Clear Clear   Leukocytes, UA Trace (A) Negative   Protein, UA Negative Negative/Trace   Glucose, UA Negative Negative   Ketones, UA Negative Negative   RBC, UA 2+ (A) Negative   Bilirubin, UA Negative Negative   Urobilinogen, Ur 0.2 0.2 - 1.0 mg/dL   Nitrite, UA Negative Negative   Microscopic Examination See below:   BUN+Creat  Result Value Ref Range   BUN 7 6 - 24 mg/dL   Creatinine, Ser 1.61 0.57 - 1.00 mg/dL   GFR calc non Af Amer 114 >59 mL/min/1.73   GFR calc Af Amer 131 >59 mL/min/1.73   BUN/Creatinine Ratio 12 9 - 23  hCG, serum,  qualitative  Result Value Ref Range   hCG,Beta Subunit,Qual,Serum Negative Negative <6 mIU/mL  BLADDER SCAN AMB NON-IMAGING  Result Value Ref Range   Scan Result 18     Pertinent Imaging: Results for JOSS, FRIEDEL (MRN [id number]) as of 04/05/2016 14:25  Ref. Range 04/03/2016 09:09  Scan Result Unknown 18    Assessment & Plan:    1. Recurrent UTI:   I'm not convinced that patient is having true urinary tract infections.  I suspect her positive urine cultures are the results of a contaminated specimen or colonization.  Her symptoms are not typical for a urinary tract infection as she has experienced epigastric pain that radiates to her upper back.  She is not had the typical dysuria, urgency or suprapubic pain.  She will be undergoing a CT Urogram in the future. The images may find an etiology for her symptoms.  - Urinalysis, Complete - BLADDER SCAN AMB NON-IMAGING  2. Microscopic hematuria:  The patient was found to have incidences of microscopic hematuria.  I explained to the patient that there are a number of causes that can be associated with blood in the urine and they range from benign findings to common entities such as urinary stones to urologic cancers. We talked about some of the potential causes of hematuria.  At this time, I felt that the patient warranted further urologic evaluation. I expressed that a CT Urogram is recommended by AUA guidelines for this situation. I spoke of the need to include intravenous contrast to better define the kidneys if the patient is able to tolerate this and the kidney function is otherwise reasonable.  She did not endorse an allergy to contrast, Iodine or seafood.  She is on metformin and her pregnancy status is unknown at this time.  We will obtain a BUN, serum creatinine and a serum pregnancy test.     The patient had the opportunity to ask questions which were answered. Based upon this discussion, the patient is willing to proceed. Therefore,  I've ordered: a CT Urogram.  She will return following the CT Urogram for discussion of the results.     Return for  CT Urogram report .  These notes generated with voice recognition software. I apologize for typographical errors.  Michiel Cowboy, PA-C  [facility] [address] [place], [place] [address] [phone]

## 2016-04-04 LAB — HCG, SERUM, QUALITATIVE: hCG,Beta Subunit,Qual,Serum: NEGATIVE m[IU]/mL (ref <6)

## 2016-04-04 LAB — BUN+CREAT
BUN/Creatinine Ratio: 12 (ref 9–23)
BUN: 7 mg/dL (ref 6–24)
Creatinine, Ser: 0.6 mg/dL (ref 0.57–1.00)
GFR calc Af Amer: 131 mL/min/{1.73_m2} (ref >59)
GFR calc non Af Amer: 114 mL/min/{1.73_m2} (ref >59)

## 2016-04-15 ENCOUNTER — Ambulatory Visit: Admission: RE | Admit: 2016-04-15 | Payer: BLUE CROSS/BLUE SHIELD | Source: Ambulatory Visit

## 2016-04-16 ENCOUNTER — Telehealth: Payer: Self-pay | Admitting: Urology

## 2016-04-16 ENCOUNTER — Encounter: Payer: Self-pay | Admitting: Internal Medicine

## 2016-04-16 NOTE — Telephone Encounter (Signed)
Patient called and cancelled her appt for 04-18-16 she never got her CT scan. She said she may reschedule it later. She wants to discuss all this with her PCP before she does anything.  Just wanted to let you know why she didn't have it done.  Thanks, Marcelino DusterMichelle

## 2016-04-18 ENCOUNTER — Ambulatory Visit: Payer: BLUE CROSS/BLUE SHIELD | Admitting: Urology

## 2016-04-18 ENCOUNTER — Encounter: Payer: Self-pay | Admitting: Urology

## 2016-05-15 ENCOUNTER — Encounter: Payer: Self-pay | Admitting: Emergency Medicine

## 2016-05-15 ENCOUNTER — Ambulatory Visit
Admission: EM | Admit: 2016-05-15 | Discharge: 2016-05-15 | Disposition: A | Discharge status: Home / Self Care | Payer: BLUE CROSS/BLUE SHIELD | Attending: Family Medicine | Admitting: Family Medicine

## 2016-05-15 ENCOUNTER — Encounter: Payer: Self-pay | Admitting: Internal Medicine

## 2016-05-15 DIAGNOSIS — IMO0001 Reserved for inherently not codable concepts without codable children: Secondary | ICD-10-CM

## 2016-05-15 DIAGNOSIS — B3749 Other urogenital candidiasis: Secondary | ICD-10-CM | POA: Diagnosis not present

## 2016-05-15 DIAGNOSIS — R531 Weakness: Secondary | ICD-10-CM

## 2016-05-15 DIAGNOSIS — R197 Diarrhea, unspecified: Secondary | ICD-10-CM

## 2016-05-15 DIAGNOSIS — R350 Frequency of micturition: Secondary | ICD-10-CM

## 2016-05-15 LAB — URINALYSIS COMPLETE WITH MICROSCOPIC (ARMC ONLY)
Bacteria, UA: NONE SEEN
Bilirubin Urine: NEGATIVE
Glucose, UA: NEGATIVE mg/dL
Ketones, ur: NEGATIVE mg/dL
Nitrite: NEGATIVE
Protein, ur: NEGATIVE mg/dL
RBC / HPF: NONE SEEN RBC/hpf (ref 0–5)
Specific Gravity, Urine: <1.005 — ABNORMAL LOW (ref 1.005–1.030)
pH: 5.5 (ref 5.0–8.0)

## 2016-05-15 MED ORDER — FLUCONAZOLE 150 MG PO TABS: 150.0000 mg | ORAL_TABLET | Freq: Once | ORAL | Status: DC

## 2016-05-15 NOTE — ED Notes (Signed)
Patient states that she had started feeling fatigue and increase in urinary frequency and loose stool that started on Tuesday.  Patient denies fevers.

## 2016-05-15 NOTE — ED Provider Notes (Signed)
CSN: [id number]     Arrival date & time 05/15/16  1110 History   First MD Initiated Contact with Patient 05/15/16 1215     Nurses notes were reviewed. Chief Complaint  Patient presents with  . Fatigue  Patient reports Tuesday she woke with diarrhea and some abdominal discomfort. Things improved by Tuesday evening and during the day Wednesday. [facility] became lightheaded and dizzy have to sit down [place] and then when she got here after she called her in-laws and they brought her here she had diarrhea while waiting to be seen. She also reports having some urinary frequency as well. Should be noted that she's had a history of recurrent UTIs she was seen and treated here several times in March as well. Is seeing Dr. Judithann GravesBerglund one time and was treated for possible UTI but cultures were negative. She is going to be seen Dr. Judithann GravesBerglund in July to get blood work and have further evaluation. The urologist is also evaluated her and felt that she should probably have a CT scan she continues to have blood in urine she is trying to hold off because of the cost of the CT scan. She also reports being evaluated for palpitations before and has been seen here has had a EKG and other tests which showed nothing at that time. She's had a cholecystectomy and colonoscopy in the recent past.  Past history includes hyperlipidemia I see anemia obesity dysmenorrhea cholestasis and conjunctivitis and hemorrhoids and hypertension. Father diabetes and left side set history of colonic polyps and colon   (Consider location/radiation/quality/duration/timing/severity/associated sxs/prior      Treatment) HPI  Past Medical History  Diagnosis Date  . Allergic rhinitis   . Anemia, iron deficiency   . Hyperlipidemia   . Obesity   . Miscarriage 2008  . Ganglion cyst   . Abnormal liver function   . Dysmenorrhea   . Cholelithiasis   . Conjunctivitis   . Hemorrhoids   . HTN (hypertension)    Past  Surgical History  Procedure Laterality Date  . Ganglion cyst excision      Cyst  . Cholecystectomy  2007  . Colonoscopy  07/2015   Family History  Problem Relation Age of Onset  . Diabetes Father   . Colon cancer Mother     died at 5360  . Colon polyps Maternal Grandmother     benign but still had colectomy  . Heart disease Father   . Heart disease Paternal Grandfather   . Aneurysm Father   . Kidney disease Neg Hx   . Bladder Cancer Neg Hx    Social History  Substance Use Topics  . Smoking status: Former Smoker    Quit date: 11/17/2004  . Smokeless tobacco: Never Used  . Alcohol Use: No   OB History    No data available     Review of Systems  All other systems reviewed and are negative.   Allergies  Betadine; Ciprofloxacin; and Neomycin-bacitracin zn-polymyx  Home Medications   Prior to Admission medications   Medication Sig Start Date End Date Taking? Authorizing Provider  amLODipine (NORVASC) 10 MG tablet Take 10 mg by mouth daily. 01/18/16   Historical Provider, MD  cetirizine (ZYRTEC) 10 MG tablet Take 10 mg by mouth daily.    Historical Provider, MD  fluconazole (DIFLUCAN) 150 MG tablet Take 1 tablet (150 mg total) by mouth once. 05/15/16   Hassan RowanEugene Jakira Mcfadden, MD  sulfamethoxazole-trimethoprim (BACTRIM DS,SEPTRA DS) 800-160 MG tablet  Take 1 tablet by mouth 2 (two) times daily. Patient not taking: Reported on 04/03/2016 03/21/16   Reubin Milan, MD   Meds Ordered and Administered this Visit  Medications - No data to display  BP 160/92 mmHg  Pulse 98  Temp(Src) 98.3 F (36.8 C) (Oral)  Resp 16  Ht  (1.753 m)  Wt 248 lb (112.492 kg)  BMI 36.61 kg/m2  SpO2 100%  LMP 04/17/2016 (Approximate) No data found.   Physical Exam  Constitutional: She is oriented to person, place, and time. She appears well-developed and well-nourished.  HENT:  Head: Normocephalic and atraumatic.  Right Ear: Hearing normal.  Left Ear: Hearing normal.  Nose: Nose normal. No  mucosal edema.  Mouth/Throat: Uvula is midline and oropharynx is clear and moist.  Eyes: Pupils are equal, round, and reactive to light.  Neck: Normal range of motion.  Cardiovascular: Normal rate, regular rhythm and normal heart sounds.   Pulmonary/Chest: Effort normal and breath sounds normal.  Abdominal: Soft. Bowel sounds are normal. There is tenderness.    Some mild tenderness right epigastric area and some mild lower abdominal tenderness but nothing specific  Musculoskeletal: Normal range of motion. She exhibits no tenderness.  Neurological: She is alert and oriented to person, place, and time.  Skin: Skin is warm and dry.  Psychiatric: Her mood appears anxious.  Vitals reviewed.   ED Course  Procedures (including critical care time)  Labs Review Labs Reviewed  URINALYSIS COMPLETEWITH MICROSCOPIC ([facility] ONLY) - Abnormal; Notable for the following:    Color, Urine STRAW (*)    Specific Gravity, Urine <1.005 (*)    Hgb urine dipstick TRACE (*)    Leukocytes, UA TRACE (*)    Squamous Epithelial / LPF 0-5 (*)    All other components within normal limits  GASTROINTESTINAL PANEL BY PCR, STOOL (REPLACES STOOL CULTURE)  URINE CULTURE    Imaging Review No results found.   Visual Acuity Review  Right Eye Distance:   Left Eye Distance:   Bilateral Distance:    Right Eye Near:   Left Eye Near:    Bilateral Near:      Results for orders placed or performed during the hospital encounter of 05/15/16  Urinalysis complete, with microscopic  Result Value Ref Range   Color, Urine STRAW (A) YELLOW   APPearance CLEAR CLEAR   Glucose, UA NEGATIVE NEGATIVE mg/dL   Bilirubin Urine NEGATIVE NEGATIVE   Ketones, ur NEGATIVE NEGATIVE mg/dL   Specific Gravity, Urine <1.005 (L) 1.005 - 1.030   Hgb urine dipstick TRACE (A) NEGATIVE   pH 5.5 5.0 - 8.0   Protein, ur NEGATIVE NEGATIVE mg/dL   Nitrite NEGATIVE NEGATIVE   Leukocytes, UA TRACE (A) NEGATIVE   RBC / HPF NONE SEEN 0 - 5  RBC/hpf   WBC, UA 0-5 0 - 5 WBC/hpf   Bacteria, UA NONE SEEN NONE SEEN   Squamous Epithelial / LPF 0-5 (A) NONE SEEN   Budding Yeast PRESENT      MDM   1. Weakness   2. Frequency   3. Diarrhea, unspecified type   4. Yeast UTI     At this time with the multiple workups that she said not going to try to redo a lot workup that she's had before. Will obtain a stool specimen for evaluation for viral or bacterial infection of the GI tract. If something is positive we'll treat urine cultures also obtain UA did show yeast so we'll treat for yeast with  Diflucan and have follow-up PCP in July as scheduled. Will give a work note for today and tomorrow.   Note: This dictation was prepared with Dragon dictation along with smaller phrase technology. Any transcriptional errors that result from this process are unintentional.  Hassan RowanEugene Sonna Lipsky, MD 05/15/16 1319

## 2016-05-15 NOTE — Discharge Instructions (Signed)
Diarrhea Diarrhea is watery poop (stool). It can make you feel weak, tired, thirsty, or give you a dry mouth (signs of dehydration). Watery poop is a sign of another problem, most often an infection. It often lasts 2-3 days. It can last longer if it is a sign of something serious. Take care of yourself as told by your doctor. HOME CARE   Drink 1 cup (8 ounces) of fluid each time you have watery poop.  Do not drink the following fluids:  Those that contain simple sugars (fructose, glucose, galactose, lactose, sucrose, maltose).  Sports drinks.  Fruit juices.  Whole milk products.  Sodas.  Drinks with caffeine (coffee, tea, soda) or alcohol.  Oral rehydration solution may be used if the doctor says it is okay. You may make your own solution. Follow this recipe:   - teaspoon table salt.   teaspoon baking soda.   teaspoon salt substitute containing potassium chloride.  1 tablespoons sugar.  1 liter (34 ounces) of water.  Avoid the following foods:  High fiber foods, such as raw fruits and vegetables.  Nuts, seeds, and whole grain breads and cereals.   Those that are sweetened with sugar alcohols (xylitol, sorbitol, mannitol).  Try eating the following foods:  Starchy foods, such as rice, toast, pasta, low-sugar cereal, oatmeal, baked potatoes, crackers, and bagels.  Bananas.  Applesauce.  Eat probiotic-rich foods, such as yogurt and milk products that are fermented.  Wash your hands well after each time you have watery poop.  Only take medicine as told by your doctor.  Take a warm bath to help lessen burning or pain from having watery poop. GET HELP RIGHT AWAY IF:   You cannot drink fluids without throwing up (vomiting).  You keep throwing up.  You have blood in your poop, or your poop looks black and tarry.  You do not pee (urinate) in 6-8 hours, or there is only a small amount of very dark pee.  You have belly (abdominal) pain that gets worse or stays  in the same spot (localizes).  You are weak, dizzy, confused, or light-headed.  You have a very bad headache.  Your watery poop gets worse or does not get better.  You have a fever or lasting symptoms for more than 2-3 days.  You have a fever and your symptoms suddenly get worse. MAKE SURE YOU:   Understand these instructions.  Will watch your condition.  Will get help right away if you are not doing well or get worse.   This information is not intended to replace advice given to you by your health care provider. Make sure you discuss any questions you have with your health care provider.   Document Released: 04/21/2008 Document Revised: 11/24/2014 Document Reviewed: 07/11/2012 Elsevier Interactive Patient Education 2016 Elsevier Inc.  Weakness Weakness is a lack of strength. You may feel weak all over your body or just in one part of your body. Weakness can be serious. In some cases, you may need more medical tests. HOME CARE  Rest.  Eat a well-balanced diet.  Try to exercise every day.  Only take medicines as told by your doctor. GET HELP RIGHT AWAY IF:   You cannot do your normal daily activities.  You cannot walk up and down stairs, or you feel very tired when you do so.  You have shortness of breath or chest pain.  You have trouble moving parts of your body.  You have weakness in only one body part or  on only one side of the body.  You have a fever.  You have trouble speaking or swallowing.  You cannot control when you pee (urinate) or poop (bowel movement).  You have black or bloody throw up (vomit) or poop.  Your weakness gets worse or spreads to other body parts.  You have new aches or pains. MAKE SURE YOU:   Understand these instructions.  Will watch your condition.  Will get help right away if you are not doing well or get worse.   This information is not intended to replace advice given to you by your health care provider. Make sure you  discuss any questions you have with your health care provider.   Document Released: 10/16/2008 Document Revised: 05/04/2012 Document Reviewed: 01/02/2012 Elsevier Interactive Patient Education [employer]2016 Elsevier Inc.

## 2016-05-16 LAB — URINE CULTURE: Special Requests: NORMAL

## 2016-05-16 LAB — MISC LABCORP TEST (SEND OUT): Labcorp test code: 183480

## 2016-05-18 ENCOUNTER — Encounter: Payer: Self-pay | Admitting: Internal Medicine

## 2016-05-25 ENCOUNTER — Telehealth: Payer: Self-pay

## 2016-05-25 NOTE — Telephone Encounter (Signed)
-----   Message from Hassan RowanEugene Wade, MD sent at 05/19/2016 11:28 AM EDT ----- Please notify patient that her stool test was negative for viral and bacteria pathogens.

## 2016-05-25 NOTE — ED Notes (Signed)
Spoke with patient. Patient advised of all results and verbalized understanding. Patient will follow up as needed.

## 2016-05-30 ENCOUNTER — Ambulatory Visit (INDEPENDENT_AMBULATORY_CARE_PROVIDER_SITE_OTHER): Payer: BLUE CROSS/BLUE SHIELD | Admitting: Internal Medicine

## 2016-05-30 ENCOUNTER — Encounter: Payer: Self-pay | Admitting: Internal Medicine

## 2016-05-30 VITALS — BP 126/84 | HR 77 | Resp 16 | Ht 69.0 in | Wt 245.0 lb

## 2016-05-30 DIAGNOSIS — R7301 Impaired fasting glucose: Secondary | ICD-10-CM

## 2016-05-30 DIAGNOSIS — R748 Abnormal levels of other serum enzymes: Secondary | ICD-10-CM

## 2016-05-30 DIAGNOSIS — I10 Essential (primary) hypertension: Secondary | ICD-10-CM

## 2016-05-30 DIAGNOSIS — E785 Hyperlipidemia, unspecified: Secondary | ICD-10-CM

## 2016-05-30 DIAGNOSIS — Z0000 Encounter for general adult medical examination without abnormal findings: Secondary | ICD-10-CM

## 2016-05-30 DIAGNOSIS — R319 Hematuria, unspecified: Secondary | ICD-10-CM

## 2016-05-30 DIAGNOSIS — R945 Abnormal results of liver function studies: Secondary | ICD-10-CM | POA: Diagnosis not present

## 2016-05-30 LAB — POC URINALYSIS WITH MICROSCOPIC (NON AUTO)MANUAL RESULT
Bilirubin, UA: NEGATIVE
Crystals: 0
Epithelial cells, urine per micros: 15
Glucose, UA: NEGATIVE
Ketones, UA: 80
Leukocytes, UA: NEGATIVE
Mucus, UA: 0
Nitrite, UA: POSITIVE
Protein, UA: NEGATIVE
RBC: 2 M/uL — AB (ref 4.04–5.48)
Spec Grav, UA: 1.02
WBC Casts, UA: 0
pH, UA: 6.5

## 2016-05-30 NOTE — Patient Instructions (Signed)
Breast Self-Awareness Practicing breast self-awareness may pick up problems early, prevent significant medical complications, and possibly save your life. By practicing breast self-awareness, you can become familiar with how your breasts look and feel and if your breasts are changing. This allows you to notice changes early. It can also offer you some reassurance that your breast health is good. One way to learn what is normal for your breasts and whether your breasts are changing is to do a breast self-exam. If you find a lump or something that was not present in the past, it is best to contact your caregiver right away. Other findings that should be evaluated by your caregiver include nipple discharge, especially if it is bloody; skin changes or reddening; areas where the skin seems to be pulled in (retracted); or new lumps and bumps. Breast pain is seldom associated with cancer (malignancy), but should also be evaluated by a caregiver. HOW TO PERFORM A BREAST SELF-EXAM The best time to examine your breasts is 5-7 days after your menstrual period is over. During menstruation, the breasts are lumpier, and it may be more difficult to pick up changes. If you do not menstruate, have reached menopause, or had your uterus removed (hysterectomy), you should examine your breasts at regular intervals, such as monthly. If you are breastfeeding, examine your breasts after a feeding or after using a breast pump. Breast implants do not decrease the risk for lumps or tumors, so continue to perform breast self-exams as recommended. Talk to your caregiver about how to determine the difference between the implant and breast tissue. Also, talk about the amount of pressure you should use during the exam. Over time, you will become more familiar with the variations of your breasts and more comfortable with the exam. A breast self-exam requires you to remove all your clothes above the waist. 1. Look at your breasts and nipples.  Stand in front of a mirror in a room with good lighting. With your hands on your hips, push your hands firmly downward. Look for a difference in shape, contour, and size from one breast to the other (asymmetry). Asymmetry includes puckers, dips, or bumps. Also, look for skin changes, such as reddened or scaly areas on the breasts. Look for nipple changes, such as discharge, dimpling, repositioning, or redness. 2. Carefully feel your breasts. This is best done either in the shower or tub while using soapy water or when flat on your back. Place the arm (on the side of the breast you are examining) above your head. Use the pads (not the fingertips) of your three middle fingers on your opposite hand to feel your breasts. Start in the underarm area and use  inch (2 cm) overlapping circles to feel your breast. Use 3 different levels of pressure (light, medium, and firm pressure) at each circle before moving to the next circle. The light pressure is needed to feel the tissue closest to the skin. The medium pressure will help to feel breast tissue a little deeper, while the firm pressure is needed to feel the tissue close to the ribs. Continue the overlapping circles, moving downward over the breast until you feel your ribs below your breast. Then, move one finger-width towards the center of the body. Continue to use the  inch (2 cm) overlapping circles to feel your breast as you move slowly up toward the collar bone (clavicle) near the base of the neck. Continue the up and down exam using all 3 pressures until you reach the   middle of the chest. Do this with each breast, carefully feeling for lumps or changes. 3.  Keep a written record with breast changes or normal findings for each breast. By writing this information down, you do not need to depend only on memory for size, tenderness, or location. Write down where you are in your menstrual cycle, if you are still menstruating. Breast tissue can have some lumps or  thick tissue. However, see your caregiver if you find anything that concerns you.  SEEK MEDICAL CARE IF:  You see a change in shape, contour, or size of your breasts or nipples.   You see skin changes, such as reddened or scaly areas on the breasts or nipples.   You have an unusual discharge from your nipples.   You feel a new lump or unusually thick areas.    This information is not intended to replace advice given to you by your health care provider. Make sure you discuss any questions you have with your health care provider.   Document Released: 11/03/2005 Document Revised: 10/20/2012 Document Reviewed: 02/18/2012 Elsevier Interactive Patient Education 2016 [employer]  

## 2016-05-30 NOTE — Progress Notes (Signed)
Date:  05/30/2016   Name:  Deanna Potts   DOB:  05-28-1976   MRN:  [id number]   Chief Complaint: Annual Exam and Flank Pain Deanna Potts is a 40 y.o. female who presents today for her Complete Annual Exam. She feels fairly well. She reports exercising some. She reports she is sleeping fairly well. She has changed her diet - avoiding salt and carbs. She is seeing GYN next week. She had some right sided lower abdominal discomfort with frequency last everning.  It moved to her lower back and this AM is resolved.  She denies dysuria or vaginal discharge/itching.  She is still trying to decide about have further urological testing for hematuria.  Review of Systems  Constitutional: Negative for fever, chills and fatigue.  HENT: Negative for congestion, hearing loss, tinnitus, trouble swallowing and voice change.   Eyes: Negative for visual disturbance.  Respiratory: Negative for cough, chest tightness, shortness of breath and wheezing.   Cardiovascular: Negative for chest pain, palpitations and leg swelling.  Gastrointestinal: Negative for vomiting, abdominal pain, diarrhea and constipation.  Endocrine: Negative for polydipsia and polyuria.  Genitourinary: Positive for frequency. Negative for dysuria, hematuria, vaginal bleeding, vaginal discharge, difficulty urinating and genital sores.  Musculoskeletal: Negative for joint swelling, arthralgias and gait problem.  Skin: Negative for color change and rash.  Neurological: Negative for dizziness, tremors, light-headedness and headaches.  Hematological: Negative for adenopathy. Does not bruise/bleed easily.  Psychiatric/Behavioral: Negative for sleep disturbance and dysphoric mood. The patient is not nervous/anxious.     Patient Active Problem List   Diagnosis Date Noted  . Environmental and seasonal allergies 02/08/2016  . Essential hypertension 02/08/2016  . Family history of colon cancer in mother 08/06/2015  . UTI 10/17/2010    . HYPERLIPIDEMIA 08/16/2007  . ALLERGIC RHINITIS 08/16/2007  . Dysmenorrhea 08/16/2007  . LIVER FUNCTION TESTS, ABNORMAL 08/16/2007    Prior to Admission medications   Medication Sig Start Date End Date Taking? Authorizing Provider  amLODipine (NORVASC) 10 MG tablet Take 10 mg by mouth daily. 01/18/16  Yes Historical Provider, MD  cetirizine (ZYRTEC) 10 MG tablet Take 10 mg by mouth daily.   Yes Historical Provider, MD    Allergies  Allergen Reactions  . Betadine [Povidone Iodine]   . Ciprofloxacin Hives  . Neomycin-Bacitracin Zn-Polymyx     REACTION: ? reaction    Past Surgical History  Procedure Laterality Date  . Ganglion cyst excision      Cyst  . Cholecystectomy  2007  . Colonoscopy  07/2015    Social History  Substance Use Topics  . Smoking status: Former Smoker    Quit date: 11/17/2004  . Smokeless tobacco: Never Used  . Alcohol Use: No     Medication list has been reviewed and updated.   Physical Exam  Constitutional: She is oriented to person, place, and time. She appears well-developed and well-nourished. No distress.  HENT:  Head: Normocephalic and atraumatic.  Right Ear: Tympanic membrane and ear canal normal.  Left Ear: Tympanic membrane and ear canal normal.  Nose: Right sinus exhibits no maxillary sinus tenderness. Left sinus exhibits no maxillary sinus tenderness.  Mouth/Throat: Uvula is midline and oropharynx is clear and moist.  Eyes: Conjunctivae and EOM are normal. Right eye exhibits no discharge. Left eye exhibits no discharge. No scleral icterus.  Neck: Normal range of motion. Carotid bruit is not present. No erythema present. No thyromegaly present.  Cardiovascular: Normal rate, regular rhythm, normal heart sounds and  normal pulses.   Pulmonary/Chest: Effort normal. No respiratory distress. She has no wheezes.  Abdominal: Soft. Bowel sounds are normal. There is no hepatosplenomegaly. There is tenderness (very mild to deep palpation) in the  suprapubic area. There is no CVA tenderness.  Musculoskeletal: Normal range of motion.  Lymphadenopathy:    She has no cervical adenopathy.    She has no axillary adenopathy.  Neurological: She is alert and oriented to person, place, and time. She has normal reflexes. No cranial nerve deficit or sensory deficit.  Skin: Skin is warm, dry and intact. No rash noted.  Psychiatric: She has a normal mood and affect. Her speech is normal and behavior is normal. Thought content normal.  Nursing note and vitals reviewed.   BP 126/84 mmHg  Pulse 77  Resp 16  Ht  (1.753 m)  Wt 245 lb (111.131 kg)  BMI 36.16 kg/m2  SpO2 97%  LMP 05/17/2016  Assessment and Plan: 1. Annual physical exam See GYN for pap, breast and mammogram - POC urinalysis w microscopic (non auto)  2. Essential hypertension controlled - CBC with Differential/Platelet - TSH  3. Elevated liver enzymes recheck - Comprehensive metabolic panel  4. Elevated fasting blood sugar Will advise on treatment if needed Has already started a healthier diet and exercise - Hemoglobin A1c  5. Hyperlipidemia - Lipid panel  6. Hematuria, undiagnosed cause May have kidney stones and may be passing small stones intermittently No infection noted today Follow up with Urology when ready   Bari Edward, MD [facility]  05/30/2016

## 2016-05-31 LAB — COMPREHENSIVE METABOLIC PANEL
ALT: 29 IU/L (ref 0–32)
AST: 11 IU/L (ref 0–40)
Albumin/Globulin Ratio: 1.6 (ref 1.2–2.2)
Albumin: 4.7 g/dL (ref 3.5–5.5)
Alkaline Phosphatase: 64 IU/L (ref 39–117)
BUN/Creatinine Ratio: 12 (ref 9–23)
BUN: 8 mg/dL (ref 6–24)
Bilirubin Total: 0.4 mg/dL (ref 0.0–1.2)
CO2: 21 mmol/L (ref 18–29)
Calcium: 9.5 mg/dL (ref 8.7–10.2)
Chloride: 97 mmol/L (ref 96–106)
Creatinine, Ser: 0.69 mg/dL (ref 0.57–1.00)
GFR calc Af Amer: 125 mL/min/{1.73_m2} (ref >59)
GFR calc non Af Amer: 108 mL/min/{1.73_m2} (ref >59)
Globulin, Total: 2.9 g/dL (ref 1.5–4.5)
Glucose: 73 mg/dL (ref 65–99)
Potassium: 4.4 mmol/L (ref 3.5–5.2)
Sodium: 139 mmol/L (ref 134–144)
Total Protein: 7.6 g/dL (ref 6.0–8.5)

## 2016-05-31 LAB — CBC WITH DIFFERENTIAL/PLATELET
Basophils Absolute: 0 10*3/uL (ref 0.0–0.2)
Basos: 0 %
EOS (ABSOLUTE): 0.2 10*3/uL (ref 0.0–0.4)
Eos: 1 %
Hematocrit: 40.3 % (ref 34.0–46.6)
Hemoglobin: 13.4 g/dL (ref 11.1–15.9)
Immature Grans (Abs): 0 10*3/uL (ref 0.0–0.1)
Immature Granulocytes: 0 %
Lymphocytes Absolute: 2.4 10*3/uL (ref 0.7–3.1)
Lymphs: 22 %
MCH: 27 pg (ref 26.6–33.0)
MCHC: 33.3 g/dL (ref 31.5–35.7)
MCV: 81 fL (ref 79–97)
Monocytes Absolute: 0.6 10*3/uL (ref 0.1–0.9)
Monocytes: 6 %
Neutrophils Absolute: 7.4 10*3/uL — ABNORMAL HIGH (ref 1.4–7.0)
Neutrophils: 71 %
Platelets: 376 10*3/uL (ref 150–379)
RBC: 4.97 x10E6/uL (ref 3.77–5.28)
RDW: 15 % (ref 12.3–15.4)
WBC: 10.5 10*3/uL (ref 3.4–10.8)

## 2016-05-31 LAB — TSH: TSH: 1.16 u[IU]/mL (ref 0.450–4.500)

## 2016-05-31 LAB — LIPID PANEL
Chol/HDL Ratio: 4.3 ratio units (ref 0.0–4.4)
Cholesterol, Total: 159 mg/dL (ref 100–199)
HDL: 37 mg/dL — ABNORMAL LOW (ref >39)
LDL Calculated: 94 mg/dL (ref 0–99)
Triglycerides: 140 mg/dL (ref 0–149)
VLDL Cholesterol Cal: 28 mg/dL (ref 5–40)

## 2016-05-31 LAB — HEMOGLOBIN A1C
Est. average glucose Bld gHb Est-mCnc: 117 mg/dL
Hgb A1c MFr Bld: 5.7 % — ABNORMAL HIGH (ref 4.8–5.6)

## 2016-06-01 ENCOUNTER — Encounter: Payer: Self-pay | Admitting: Internal Medicine

## 2016-06-03 ENCOUNTER — Encounter: Payer: Self-pay | Admitting: Internal Medicine

## 2016-06-03 DIAGNOSIS — Z1231 Encounter for screening mammogram for malignant neoplasm of breast: Secondary | ICD-10-CM | POA: Diagnosis not present

## 2016-06-03 DIAGNOSIS — Z6836 Body mass index (BMI) 36.0-36.9, adult: Secondary | ICD-10-CM | POA: Diagnosis not present

## 2016-06-03 DIAGNOSIS — Z01419 Encounter for gynecological examination (general) (routine) without abnormal findings: Secondary | ICD-10-CM | POA: Diagnosis not present

## 2016-07-25 ENCOUNTER — Encounter: Payer: Self-pay | Admitting: Internal Medicine

## 2016-07-25 ENCOUNTER — Ambulatory Visit (INDEPENDENT_AMBULATORY_CARE_PROVIDER_SITE_OTHER): Payer: BLUE CROSS/BLUE SHIELD | Admitting: Internal Medicine

## 2016-07-25 VITALS — BP 128/86 | HR 82 | Temp 99.3°F | Resp 16 | Ht 69.0 in | Wt 238.0 lb

## 2016-07-25 DIAGNOSIS — N3000 Acute cystitis without hematuria: Secondary | ICD-10-CM | POA: Diagnosis not present

## 2016-07-25 LAB — POC URINALYSIS WITH MICROSCOPIC (NON AUTO)MANUAL RESULT
Bilirubin, UA: NEGATIVE
Crystals: 0
Epithelial cells, urine per micros: 2
Glucose, UA: NEGATIVE
Ketones, UA: NEGATIVE
Mucus, UA: 2
Nitrite, UA: POSITIVE
Protein, UA: NEGATIVE
RBC: 5 M/uL (ref 4.04–5.48)
Spec Grav, UA: 1.02
WBC Casts, UA: 10
pH, UA: 6.5

## 2016-07-25 MED ORDER — SULFAMETHOXAZOLE-TRIMETHOPRIM 800-160 MG PO TABS: 1.0000 | ORAL_TABLET | Freq: Two times a day (BID) | ORAL | 0 refills | Status: DC

## 2016-07-25 NOTE — Patient Instructions (Signed)

## 2016-07-25 NOTE — Progress Notes (Signed)
    Date:  07/25/2016   Name:  Deanna AlbertsChristy Lynn Hackmann   DOB:  12-Dec-1975   MRN:  [id number]   Chief Complaint: Pelvic Pain (pressure at bladder and back and umbillical region. She took laxitive a she is constipated but she went several times yesterday but still having pressure in pelvic area.)    Review of Systems  Constitutional: Positive for diaphoresis. Negative for chills, fatigue and fever.  Respiratory: Negative for chest tightness, shortness of breath and stridor.   Cardiovascular: Negative for chest pain, palpitations and leg swelling.  Gastrointestinal: Positive for abdominal pain and constipation. Negative for abdominal distention.  Genitourinary: Positive for dysuria, frequency and urgency. Negative for hematuria, vaginal bleeding and vaginal discharge.  Neurological: Negative for headaches.  Hematological: Negative for adenopathy.    Patient Active Problem List   Diagnosis Date Noted  . Hematuria, undiagnosed cause 05/30/2016  . Environmental and seasonal allergies 02/08/2016  . Essential hypertension 02/08/2016  . Family history of colon cancer in mother 08/06/2015  . Hyperlipidemia 08/16/2007  . ALLERGIC RHINITIS 08/16/2007  . Dysmenorrhea 08/16/2007  . LIVER FUNCTION TESTS, ABNORMAL 08/16/2007    Prior to Admission medications   Medication Sig Start Date End Date Taking? Authorizing Provider  amLODipine (NORVASC) 10 MG tablet Take 10 mg by mouth daily. 01/18/16  Yes Historical Provider, MD  cetirizine (ZYRTEC) 10 MG tablet Take 10 mg by mouth daily.   Yes Historical Provider, MD  Multiple Vitamin (MULTIVITAMIN) capsule Take 1 capsule by mouth daily.   Yes Historical Provider, MD    Allergies  Allergen Reactions  . Betadine [Povidone Iodine]   . Ciprofloxacin Hives  . Neomycin-Bacitracin Zn-Polymyx     REACTION: ? reaction    Past Surgical History:  Procedure Laterality Date  . CHOLECYSTECTOMY  2007  . COLONOSCOPY  07/2015  . GANGLION CYST EXCISION     Cyst     Social History  Substance Use Topics  . Smoking status: Former Smoker    Quit date: 11/17/2004  . Smokeless tobacco: Never Used  . Alcohol use No     Medication list has been reviewed and updated.   Physical Exam  Constitutional: She appears well-developed and well-nourished.  Cardiovascular: Normal rate, regular rhythm and normal heart sounds.   Pulmonary/Chest: Effort normal and breath sounds normal. No respiratory distress.  Abdominal: Soft. Bowel sounds are normal. There is tenderness in the suprapubic area. There is CVA tenderness. There is no rebound and no guarding.  Psychiatric: She has a normal mood and affect.  Nursing note and vitals reviewed.   BP 128/86 (BP Location: Right Arm)   Pulse 82   Temp 99.3 F (37.4 C)   Resp 16   Ht 5\' 9"  (1.753 m)   Wt 238 lb (108 kg)   LMP 07/11/2016   SpO2 97%   BMI 35.15 kg/m   Assessment and Plan: 1. Acute cystitis without hematuria Increase water intake - POC urinalysis w microscopic (non auto) - sulfamethoxazole-trimethoprim (BACTRIM DS,SEPTRA DS) 800-160 MG tablet; Take 1 tablet by mouth 2 (two) times daily.  Dispense: 20 tablet; Refill: 0   Bari EdwardLaura Rishaan Gunner, MD [facility]  07/25/2016

## 2016-08-01 ENCOUNTER — Encounter: Payer: Self-pay | Admitting: Internal Medicine

## 2016-08-04 DIAGNOSIS — Z23 Encounter for immunization: Secondary | ICD-10-CM | POA: Diagnosis not present

## 2016-08-13 ENCOUNTER — Ambulatory Visit (INDEPENDENT_AMBULATORY_CARE_PROVIDER_SITE_OTHER): Payer: BLUE CROSS/BLUE SHIELD | Admitting: Internal Medicine

## 2016-08-13 ENCOUNTER — Encounter: Payer: Self-pay | Admitting: Internal Medicine

## 2016-08-13 VITALS — BP 136/82 | HR 88 | Temp 98.2°F | Resp 16 | Ht 69.0 in | Wt 238.0 lb

## 2016-08-13 DIAGNOSIS — N3001 Acute cystitis with hematuria: Secondary | ICD-10-CM

## 2016-08-13 LAB — POC URINALYSIS WITH MICROSCOPIC (NON AUTO)MANUAL RESULT
Bilirubin, UA: NEGATIVE
Crystals: 0
Epithelial cells, urine per micros: 10
Glucose, UA: NEGATIVE
Mucus, UA: 0
Nitrite, UA: NEGATIVE
Protein, UA: NEGATIVE
RBC: 5 M/uL (ref 4.04–5.48)
Spec Grav, UA: 1.015
Urobilinogen, UA: 0.2
WBC Casts, UA: 5
pH, UA: 6

## 2016-08-13 MED ORDER — SULFAMETHOXAZOLE-TRIMETHOPRIM 800-160 MG PO TABS: 1.0000 | ORAL_TABLET | Freq: Two times a day (BID) | ORAL | 0 refills | Status: DC

## 2016-08-13 NOTE — Progress Notes (Signed)
Date:  08/13/2016   Name:  Deanna Potts   DOB:  1976/06/20   MRN:  [id number]   Chief Complaint: Urinary Frequency (Started Monday) Onset of frequency and urgency without dysuria or bleeding 2 days ago.  Now has some right flank discomfort.  No fever or chills.  Just treated 3 weeks ago for UTI. She has been seen by Urology for recurrent UTI and hematuria. Partial workup done - [place] was normal.  CT recommended but pt wanted to hold off due to cost.  Review of Systems  Constitutional: Negative for chills, fatigue and fever.  Respiratory: Negative for chest tightness and shortness of breath.   Cardiovascular: Negative for chest pain.  Gastrointestinal: Negative for abdominal distention and abdominal pain.  Genitourinary: Positive for frequency and urgency. Negative for difficulty urinating, dysuria and hematuria.  Musculoskeletal: Positive for back pain (right flank).    Patient Active Problem List   Diagnosis Date Noted  . Hematuria, undiagnosed cause 05/30/2016  . Environmental and seasonal allergies 02/08/2016  . Essential hypertension 02/08/2016  . Family history of colon cancer in mother 08/06/2015  . Hyperlipidemia 08/16/2007  . ALLERGIC RHINITIS 08/16/2007  . Dysmenorrhea 08/16/2007  . LIVER FUNCTION TESTS, ABNORMAL 08/16/2007    Prior to Admission medications   Medication Sig Start Date End Date Taking? Authorizing Provider  amLODipine (NORVASC) 10 MG tablet Take 10 mg by mouth daily. 01/18/16  Yes Historical Provider, MD  cetirizine (ZYRTEC) 10 MG tablet Take 10 mg by mouth daily.   Yes Historical Provider, MD  Multiple Vitamin (MULTIVITAMIN) capsule Take 1 capsule by mouth daily.   Yes Historical Provider, MD    Allergies  Allergen Reactions  . Betadine [Povidone Iodine]   . Ciprofloxacin Hives  . Neomycin-Bacitracin Zn-Polymyx     REACTION: ? reaction    Past Surgical History:  Procedure Laterality Date  . CHOLECYSTECTOMY  2007  . COLONOSCOPY  07/2015  .  GANGLION CYST EXCISION     Cyst    Social History  Substance Use Topics  . Smoking status: Former Smoker    Quit date: 11/17/2004  . Smokeless tobacco: Never Used  . Alcohol use No     Medication list has been reviewed and updated.   Physical Exam  Constitutional: She is oriented to person, place, and time. She appears well-developed. No distress.  HENT:  Head: Normocephalic and atraumatic.  Cardiovascular: Normal rate, regular rhythm and normal heart sounds.   Pulmonary/Chest: Effort normal and breath sounds normal. No respiratory distress.  Abdominal: There is no tenderness. There is no CVA tenderness.  Musculoskeletal: Normal range of motion.  Neurological: She is alert and oriented to person, place, and time.  Skin: Skin is warm and dry. No rash noted.  Psychiatric: She has a normal mood and affect. Her behavior is normal. Thought content normal.  Nursing note and vitals reviewed.  Urine dipstick shows positive for RBC's and positive for nitrates.  Micro exam: 5 WBC's per HPF plus RBCs and bacteria.   BP (!) 148/102   Pulse 88   Temp 98.2 F (36.8 C)   Resp 16   Ht 5\' 9"  (1.753 m)   Wt 238 lb (108 kg)   LMP 08/02/2016 (Exact Date)   SpO2 98%   BMI 35.15 kg/m   Assessment and Plan: 1. Acute cystitis with hematuria Need to follow up with Urology for CT to further evaluate cause of recurrent/persistent hematuria and bacteruria - POC urinalysis w microscopic (non auto) -  sulfamethoxazole-trimethoprim (BACTRIM DS,SEPTRA DS) 800-160 MG tablet; Take 1 tablet by mouth 2 (two) times daily.  Dispense: 20 tablet; Refill: 0   Bari EdwardLaura Shyah Cadmus, MD [facility]  08/13/2016

## 2016-08-20 ENCOUNTER — Ambulatory Visit
Admission: RE | Admit: 2016-08-20 | Discharge: 2016-08-20 | Disposition: A | Discharge status: Home / Self Care | Payer: BLUE CROSS/BLUE SHIELD | Source: Ambulatory Visit | Attending: Urology | Admitting: Urology

## 2016-08-20 DIAGNOSIS — R3129 Other microscopic hematuria: Secondary | ICD-10-CM | POA: Diagnosis not present

## 2016-08-20 DIAGNOSIS — N200 Calculus of kidney: Secondary | ICD-10-CM | POA: Insufficient documentation

## 2016-08-20 MED ORDER — IOPAMIDOL (ISOVUE-300) INJECTION 61%
150.0000 mL | Freq: Once | INTRAVENOUS | Status: AC | PRN
  Administered 2016-08-20: 125 mL via INTRAVENOUS

## 2016-08-25 ENCOUNTER — Ambulatory Visit (INDEPENDENT_AMBULATORY_CARE_PROVIDER_SITE_OTHER): Payer: BLUE CROSS/BLUE SHIELD | Admitting: Urology

## 2016-08-25 ENCOUNTER — Encounter: Payer: Self-pay | Admitting: Urology

## 2016-08-25 VITALS — BP 141/81 | HR 77 | Ht 69.0 in | Wt 239.5 lb

## 2016-08-25 DIAGNOSIS — R109 Unspecified abdominal pain: Secondary | ICD-10-CM

## 2016-08-25 DIAGNOSIS — R3129 Other microscopic hematuria: Secondary | ICD-10-CM | POA: Diagnosis not present

## 2016-08-25 DIAGNOSIS — N390 Urinary tract infection, site not specified: Secondary | ICD-10-CM

## 2016-08-25 LAB — MICROSCOPIC EXAMINATION: Epithelial Cells (non renal): >10 /HPF — AB

## 2016-08-25 LAB — URINALYSIS, COMPLETE
Bilirubin, UA: NEGATIVE
Glucose, UA: NEGATIVE
Ketones, UA: NEGATIVE
Nitrite, UA: NEGATIVE
Protein, UA: NEGATIVE
Specific Gravity, UA: 1.01 (ref 1.005–1.030)
Urobilinogen, Ur: 0.2 mg/dL (ref 0.2–1.0)
pH, UA: 6.5 (ref 5.0–7.5)

## 2016-08-25 NOTE — Progress Notes (Addendum)
08/25/2016 10:19 AM   Neysa Bonitohristy Myrlene BrokerLynn Febo [birth year] [id number]  Referring provider: Reubin MilanLaura H Berglund, MD [address] [place], [place] [id number]  Chief Complaint  Patient presents with  . Results    CT    HPI: Patient is a 40 year old Caucasian who presents today to discuss her CT Urogram report.   Background history Patient was a referral from their PCP, Dr. Judithann GravesBerglund, for recurrent UTI's.    Patient was found to have microscopic hematuria on 11/30/2015 with 0-5 RBC's/hpf with a urine culture positive for staph aureus with mixed bacterial organisms.   She was treated with Bactrim.   Patient then had microscopic hematuria on 01/29/2016 with 0-5 rbc's/hpf with urine culture positive for multiple species   Her microscopic UA today is positive for 3-10 rbc's/hpf.  Patient doesn't have a prior history of microscopic hematuria.   She does not have a prior history of recurrent urinary tract infections.   She does not have a history of nephrolithiasis, trauma to the genitourinary tract or malignancies of the genitourinary tract.  She does have a family medical history of nephrolithiasis paternal aunt,but no family history of  malignancies of the genitourinary tract or hematuria.  Her symptoms are not typical of a urinary tract infection as she is having pain in the epigastric region and radiates to her upper back bilaterally.   Last year, she stated she had symptoms of heart palpitations and felt queasy and was treated for a urinary tract infection at that time.  She is  not experiencing any suprapubic pain or flank pain. She denies any recent fevers, chills, nausea or vomiting.   She is a former smoker with a 1 ppd history.  Quit one year ago.  She is exposed to secondhand smoke.  She has not worked with chemicals.    Her UA today demonstrates 3-10 RBC's and 11-30 WBC's.  CT Urogram performed on 08/20/2016 demonstrated lower portion of the right ureter is poorly opacified, limiting  evaluation. Otherwise, no findings to explain the patient's hematuria and recurrent UTI's.  Punctate left renal stone.  I have independently reviewed her films.  She is not having any symptoms today.  She did have another onset of right upper quadrant pain that radiates to her right waist.  She was given an antibiotic and treated for an UTI as she had blood in her urine and a small amount of leukocytes.  She has finished her antibiotic.  She has not started her menstrual cycle at this time.    She is not experiencing dysuria, gross hematuria or suprapubic pain.  She is not having fevers, chills, nausea or vomiting.     PMH: Past Medical History:  Diagnosis Date  . Abnormal liver function   . Allergic rhinitis   . Anemia, iron deficiency   . Cholelithiasis   . Conjunctivitis   . Dysmenorrhea   . Ganglion cyst   . Hemorrhoids   . HTN (hypertension)   . Hyperlipidemia   . Miscarriage 2008  . Obesity     Surgical History: Past Surgical History:  Procedure Laterality Date  . CHOLECYSTECTOMY  2007  . COLONOSCOPY  07/2015  . GANGLION CYST EXCISION     Cyst    Home Medications:    Medication List       Accurate as of 08/25/16 10:19 AM. Always use your most recent med list.          amLODipine 10 MG tablet Commonly known as:  NORVASC Take 10 mg by mouth daily.   cetirizine 10 MG tablet Commonly known as:  ZYRTEC Take 10 mg by mouth daily.   multivitamin capsule Take 1 capsule by mouth daily.   sulfamethoxazole-trimethoprim 800-160 MG tablet Commonly known as:  BACTRIM DS,SEPTRA DS Take 1 tablet by mouth 2 (two) times daily.       Allergies:  Allergies  Allergen Reactions  . Betadine [Povidone Iodine]   . Ciprofloxacin Hives  . Neomycin-Bacitracin Zn-Polymyx     REACTION: ? reaction    Family History: Family History  Problem Relation Age of Onset  . Colon cancer Mother     died at 31  . Diabetes Father   . Heart disease Father   . Aneurysm Father   .  Colon polyps Maternal Grandmother     benign but still had colectomy  . Heart disease Paternal Grandfather   . Kidney disease Neg Hx   . Bladder Cancer Neg Hx     Social History:  reports that she quit smoking about 11 years ago. She has never used smokeless tobacco. She reports that she does not drink alcohol or use drugs.  ROS: UROLOGY Frequent Urination?: No Hard to postpone urination?: No Burning/pain with urination?: No Get up at night to urinate?: No Leakage of urine?: No Urine stream starts and stops?: No Trouble starting stream?: No Do you have to strain to urinate?: No Blood in urine?: No Urinary tract infection?: No Sexually transmitted disease?: No Injury to kidneys or bladder?: No Painful intercourse?: No Weak stream?: No Currently pregnant?: No Vaginal bleeding?: No Last menstrual period?: n  Gastrointestinal Nausea?: No Vomiting?: No Indigestion/heartburn?: No Diarrhea?: No Constipation?: No  Constitutional Fever: No Night sweats?: No Weight loss?: No Fatigue?: No  Skin Skin rash/lesions?: No Itching?: No  Eyes Blurred vision?: No Double vision?: No  Ears/Nose/Throat Sore throat?: No Sinus problems?: No  Hematologic/Lymphatic Swollen glands?: No Easy bruising?: No  Cardiovascular Leg swelling?: No Chest pain?: No  Respiratory Cough?: No Shortness of breath?: No  Endocrine Excessive thirst?: No  Musculoskeletal Back pain?: No Joint pain?: No  Neurological Headaches?: No Dizziness?: No  Psychologic Depression?: No Anxiety?: No  Physical Exam: BP (!) 141/81   Pulse 77   Ht 5\' 9"  (1.753 m)   Wt 239 lb 8 oz (108.6 kg)   LMP 08/02/2016 (Exact Date) Comment: no chance of preg per pt , pt husband had a vasectomy 5 yrs ago.   BMI 35.37 kg/m   Constitutional: Well nourished. Alert and oriented, No acute distress. HEENT: [place] AT, moist mucus membranes. Trachea midline, no masses. Cardiovascular: No clubbing, cyanosis, or  edema. Respiratory: Normal respiratory effort, no increased work of breathing. Skin: No rashes, bruises or suspicious lesions. Lymph: No cervical or inguinal adenopathy. Neurologic: Grossly intact, no focal deficits, moving all 4 extremities. Psychiatric: Normal mood and affect.  Laboratory Data: Lab Results  Component Value Date   WBC 10.5 05/30/2016   HGB 12.3 02/26/2010   HCT 40.3 05/30/2016   MCV 81 05/30/2016   PLT 376 05/30/2016    Lab Results  Component Value Date   CREATININE 0.69 05/30/2016       Component Value Date/Time   CHOL 159 05/30/2016 1036   HDL 37 (L) 05/30/2016 1036   CHOLHDL 4.3 05/30/2016 1036   CHOLHDL 6.9 CALC 08/26/2007 1249   VLDL 49 (H) 08/26/2007 1249   LDLCALC 94 05/30/2016 1036    Lab Results  Component Value Date   AST 11  05/30/2016   Lab Results  Component Value Date   ALT 29 05/30/2016    Urinalysis Significant for 3-10 RBC's and 11-30 WBC's.  See EPIC.  Pertinent Imaging: CLINICAL DATA:  Microscopic hematuria and urinary tract infections.  EXAM: CT ABDOMEN AND PELVIS WITHOUT AND WITH CONTRAST  TECHNIQUE: Multidetector CT imaging of the abdomen and pelvis was performed following the standard protocol before and following the bolus administration of intravenous contrast.  CONTRAST:  ISOVUE-300 IOPAMIDOL (ISOVUE-300) INJECTION 61%  COMPARISON:  None.  FINDINGS: Lower chest: Lung bases show no acute findings. Heart size normal. No pericardial or pleural effusion.  Hepatobiliary: Liver is unremarkable. Cholecystectomy. No biliary ductal dilatation.  Pancreas: Negative.  Spleen: Negative.  Adrenals/Urinary Tract: Adrenal glands and right kidney are unremarkable. Punctate stone in the left kidney. Sub cm low-attenuation lesion in the left kidney is too small to characterize but likely a cyst. Lower portion of the right ureter is poorly opacified limiting evaluation. Otherwise, no filling defects in  the intrarenal collecting systems, ureters or bladder.  Stomach/Bowel: Stomach, small bowel, appendix and colon are unremarkable.  Vascular/Lymphatic: Vascular structures are unremarkable. No pathologically enlarged lymph nodes.  Reproductive: Uterus is visualized. Probable 4.2 cm physiologic cyst in the left ovary. Otherwise, no adnexal mass. There may be a sub cm right-sided vaginal wall cyst (series 6, image 100).  Other: No free fluid.  Mesenteries and peritoneum are unremarkable.  Musculoskeletal: No worrisome lytic or sclerotic lesions.  IMPRESSION: 1. Lower portion of the right ureter is poorly opacified, limiting evaluation. Otherwise, no findings to explain the patient's hematuria and recurrent UTIs. 2. Punctate left renal stone.   Electronically Signed   By: Leanna Battles M.D.   On: 08/20/2016 13:43  Assessment & Plan:    1. Recurrent UTI:   I'm not convinced that patient is having true urinary tract infections.  I suspect her positive urine cultures are the results of a contaminated specimen or colonization.  Her symptoms are not typical for a urinary tract infection as she has experienced epigastric pain that radiates to her upper back.  She is not had the typical dysuria, urgency or suprapubic pain.  CT Urogram did not identify any etiology for her symptoms.    - Urinalysis, Complete  - urine sent for culture  2. Microscopic hematuria:  CT Urogram did not identify any etiology for her symptoms or hematuria.  I explained that since her ureters did not completely opacify on the study, we could potentially miss some urologic tumors.  I did offer cystoscopy with retrogrades in the OR vs cystoscopy in the office.  She has chosen to undergo a cystoscopy in the office.    3. Abdominal pain  - check lipase for possible pancreatic involvement  Return for cystocopy for hematuria.  These notes generated with voice recognition software. I apologize for typographical  errors.  Michiel Cowboy, PA-C  [facility] [address], [address] [place], [place] [address] [phone]

## 2016-08-26 ENCOUNTER — Telehealth: Payer: Self-pay

## 2016-08-26 LAB — LIPASE: Lipase: 51 U/L (ref 14–72)

## 2016-08-26 NOTE — Telephone Encounter (Signed)
-----   Message from Harle BattiestShannon A McGowan, PA-C sent at 08/26/2016  8:36 AM EDT ----- Please notify the patient that her lipase is normal, so her pancreas should be fine.

## 2016-08-26 NOTE — Telephone Encounter (Signed)
Spoke with pt in reference to lab results. Pt voiced understanding.  

## 2016-08-28 LAB — CULTURE, URINE COMPREHENSIVE

## 2016-09-07 ENCOUNTER — Encounter: Payer: Self-pay | Admitting: Internal Medicine

## 2016-09-19 ENCOUNTER — Ambulatory Visit (INDEPENDENT_AMBULATORY_CARE_PROVIDER_SITE_OTHER): Payer: BLUE CROSS/BLUE SHIELD | Admitting: Urology

## 2016-09-19 ENCOUNTER — Encounter: Payer: Self-pay | Admitting: Urology

## 2016-09-19 VITALS — BP 154/87 | HR 73 | Ht 69.0 in | Wt 241.0 lb

## 2016-09-19 DIAGNOSIS — R3129 Other microscopic hematuria: Secondary | ICD-10-CM

## 2016-09-19 LAB — URINALYSIS, COMPLETE
Bilirubin, UA: NEGATIVE
Glucose, UA: NEGATIVE
Ketones, UA: NEGATIVE
Nitrite, UA: NEGATIVE
Protein, UA: NEGATIVE
Specific Gravity, UA: >1.03 — ABNORMAL HIGH (ref 1.005–1.030)
Urobilinogen, Ur: 0.2 mg/dL (ref 0.2–1.0)
pH, UA: 5 (ref 5.0–7.5)

## 2016-09-19 LAB — MICROSCOPIC EXAMINATION: RBC, UA: >30 /hpf — AB (ref 0–?)

## 2016-09-19 MED ORDER — LIDOCAINE HCL 2 % EX GEL
1.0000 "application " | Freq: Once | CUTANEOUS | Status: AC
  Administered 2016-09-19: 1 via URETHRAL

## 2016-09-19 MED ORDER — DOXYCYCLINE HYCLATE 100 MG PO TABS
100.0000 mg | ORAL_TABLET | Freq: Once | ORAL | Status: AC
  Administered 2016-09-19: 100 mg via ORAL

## 2016-09-19 NOTE — Progress Notes (Signed)
   09/19/16  CC:  Chief Complaint  Patient presents with  . Cysto    HPI: 40 yo F with microscopic hematuria Who returns to the office for cystoscopy.  CT urogram 08/20/16 shows no evidence of GU pathology. She does have a punctate left renal stone. Unfortunately, the lower portion of the right ureter was not completely opacified.    She is a former smoker, quit 10 years ago.  UA today has persistent microscopic hematuria although she is currently menstruating. Otherwise negative.  Blood pressure (!) 154/87, pulse 73, height 5\' 9"  (1.753 m), weight 241 lb (109.3 kg), last menstrual period 08/02/2016. NED. A&Ox3.   No respiratory distress   Abd soft, NT, ND Normal external genitalia with patent urethral meatus  Cystoscopy Procedure Note  Patient identification was confirmed, informed consent was obtained, and patient was prepped using Betadine solution.  Lidocaine jelly was administered per urethral meatus.    Preoperative abx where received prior to procedure.    Procedure: - Flexible cystoscope introduced, without any difficulty.   - Thorough search of the bladder revealed:    normal urethral meatus, angled somewhat posteriorly    normal urothelium    no stones    no ulcers     no tumors    no urethral polyps    no trabeculation  - Ureteral orifices were normal in position and appearance.  Post-Procedure: - Patient tolerated the procedure well  Assessment/ Plan:  1. Microscopic hematuria Unremarkable cystoscopy today CT urogram unremarkable, did discuss incomplete opacification of distal ureter and small risk of missing a malignancy She was offered cystoscopy, retrograde pyelogram but declined She understands the risk of upper tract urothelial carcinoma and the small segment of ureter is very low but not impossible and is willing to accept that risk which seems reasonable   Follow up as needed  Vanna ScotlandAshley Clorinda Wyble, MD

## 2016-09-23 ENCOUNTER — Encounter: Payer: Self-pay | Admitting: Internal Medicine

## 2016-09-29 ENCOUNTER — Encounter: Payer: Self-pay | Admitting: Urology

## 2016-09-30 ENCOUNTER — Encounter: Payer: Self-pay | Admitting: Internal Medicine

## 2016-10-03 ENCOUNTER — Ambulatory Visit (INDEPENDENT_AMBULATORY_CARE_PROVIDER_SITE_OTHER): Payer: BLUE CROSS/BLUE SHIELD | Admitting: Internal Medicine

## 2016-10-03 ENCOUNTER — Encounter: Payer: Self-pay | Admitting: Internal Medicine

## 2016-10-03 VITALS — BP 126/82 | HR 99 | Temp 98.8°F | Resp 16 | Ht 69.0 in | Wt 242.0 lb

## 2016-10-03 DIAGNOSIS — N3001 Acute cystitis with hematuria: Secondary | ICD-10-CM

## 2016-10-03 LAB — POCT URINALYSIS DIPSTICK
Bilirubin, UA: NEGATIVE
Glucose, UA: NEGATIVE
Ketones, UA: NEGATIVE
Leukocytes, UA: NEGATIVE
Nitrite, UA: NEGATIVE
Protein, UA: NEGATIVE
Spec Grav, UA: 1.01
Urobilinogen, UA: 0.2
pH, UA: 6

## 2016-10-03 MED ORDER — SULFAMETHOXAZOLE-TRIMETHOPRIM 800-160 MG PO TABS
1.0000 | ORAL_TABLET | Freq: Two times a day (BID) | ORAL | 0 refills | Status: DC
Start: 2016-10-03 — End: 2017-03-02

## 2016-10-03 NOTE — Patient Instructions (Signed)
Take AZO twice a day for several days.

## 2016-10-03 NOTE — Progress Notes (Signed)
    Date:  10/03/2016   Name:  Deanna Potts   DOB:  March 11, 1976   MRN:  [id number]   Chief Complaint: Abdominal Pain (Saw Uro on 09/19/2016 and they did cystostomy and caused severe pain and she feels this is from that but that she now has bladder infection. No fever-some abdominal pain and bloating. ) and Hand Pain (Cramp in Left hand. )    Review of Systems  Constitutional: Positive for diaphoresis. Negative for chills, fatigue and fever.  Respiratory: Negative for chest tightness and shortness of breath.   Cardiovascular: Negative for chest pain and palpitations.  Gastrointestinal: Positive for abdominal distention. Negative for constipation and diarrhea.  Genitourinary: Positive for menstrual problem (irregular). Negative for difficulty urinating, dysuria and vaginal discharge.    Patient Active Problem List   Diagnosis Date Noted  . Hematuria, undiagnosed cause 05/30/2016  . Environmental and seasonal allergies 02/08/2016  . Essential hypertension 02/08/2016  . Family history of colon cancer in mother 08/06/2015  . Hyperlipidemia 08/16/2007  . ALLERGIC RHINITIS 08/16/2007  . Dysmenorrhea 08/16/2007  . LIVER FUNCTION TESTS, ABNORMAL 08/16/2007    Prior to Admission medications   Medication Sig Start Date End Date Taking? Authorizing Provider  amLODipine (NORVASC) 10 MG tablet Take 10 mg by mouth daily. 01/18/16  Yes Historical Provider, MD  cetirizine (ZYRTEC) 10 MG tablet Take 10 mg by mouth daily.   Yes Historical Provider, MD  Multiple Vitamin (MULTIVITAMIN) capsule Take 1 capsule by mouth daily.   Yes Historical Provider, MD    Allergies  Allergen Reactions  . Betadine [Povidone Iodine]   . Ciprofloxacin Hives  . Neomycin-Bacitracin Zn-Polymyx     REACTION: ? reaction    Past Surgical History:  Procedure Laterality Date  . CHOLECYSTECTOMY  2007  . COLONOSCOPY  07/2015  . GANGLION CYST EXCISION     Cyst    Social History  Substance Use Topics  . Smoking  status: Former Smoker    Quit date: 11/17/2004  . Smokeless tobacco: Never Used  . Alcohol use No     Medication list has been reviewed and updated.   Physical Exam  Constitutional: She is oriented to person, place, and time. She appears well-developed. No distress.  HENT:  Head: Normocephalic and atraumatic.  Cardiovascular: Normal rate, regular rhythm and normal heart sounds.   Pulmonary/Chest: Effort normal and breath sounds normal. No respiratory distress.  Abdominal: Soft. Normal appearance. There is no tenderness. There is no guarding and no CVA tenderness.  Musculoskeletal: Normal range of motion.  Neurological: She is alert and oriented to person, place, and time.  Skin: Skin is warm and dry. No rash noted.  Psychiatric: She has a normal mood and affect. Her behavior is normal. Thought content normal.  Nursing note and vitals reviewed.   BP 126/82   Pulse 99   Temp 98.8 F (37.1 C)   Resp 16   Ht 5\' 9"  (1.753 m)   Wt 242 lb (109.8 kg)   LMP 09/21/2016 (LMP Unknown)   SpO2 99%   BMI 35.74 kg/m   Assessment and Plan: 1. Acute cystitis with hematuria Possible early sx without evidence on dipstick Begin AZO Rx for Septra given to begin if sx worsen - POCT urinalysis dipstick - sulfamethoxazole-trimethoprim (BACTRIM DS,SEPTRA DS) 800-160 MG tablet; Take 1 tablet by mouth 2 (two) times daily.  Dispense: 14 tablet; Refill: 0   Bari EdwardLaura Tinleigh Whitmire, MD [facility]  10/03/2016

## 2016-10-03 NOTE — Progress Notes (Deleted)
bladder

## 2016-10-03 NOTE — Progress Notes (Deleted)
    Date:  10/03/2016   Name:  Deanna Potts   DOB:  August 26, 1976   MRN:  [id number]   Chief Complaint: Abdominal Pain (Saw Uro on 09/19/2016 and they did cystostomy and caused severe pain and she feels this is from that but that she now has bladder infection. No fever-some abdominal pain and bloating. ) and Hand Pain (Cramp in Left hand. )    Review of Systems  Patient Active Problem List   Diagnosis Date Noted  . Hematuria, undiagnosed cause 05/30/2016  . Environmental and seasonal allergies 02/08/2016  . Essential hypertension 02/08/2016  . Family history of colon cancer in mother 08/06/2015  . Hyperlipidemia 08/16/2007  . ALLERGIC RHINITIS 08/16/2007  . Dysmenorrhea 08/16/2007  . LIVER FUNCTION TESTS, ABNORMAL 08/16/2007    Prior to Admission medications   Medication Sig Start Date End Date Taking? Authorizing Provider  amLODipine (NORVASC) 10 MG tablet Take 10 mg by mouth daily. 01/18/16  Yes Historical Provider, MD  cetirizine (ZYRTEC) 10 MG tablet Take 10 mg by mouth daily.   Yes Historical Provider, MD  Multiple Vitamin (MULTIVITAMIN) capsule Take 1 capsule by mouth daily.   Yes Historical Provider, MD    Allergies  Allergen Reactions  . Betadine [Povidone Iodine]   . Ciprofloxacin Hives  . Neomycin-Bacitracin Zn-Polymyx     REACTION: ? reaction    Past Surgical History:  Procedure Laterality Date  . CHOLECYSTECTOMY  2007  . COLONOSCOPY  07/2015  . GANGLION CYST EXCISION     Cyst    Social History  Substance Use Topics  . Smoking status: Former Smoker    Quit date: 11/17/2004  . Smokeless tobacco: Never Used  . Alcohol use No     Medication list has been reviewed and updated.   Physical Exam  BP 126/82   Pulse 99   Temp 98.8 F (37.1 C)   Resp 16   Ht 5\' 9"  (1.753 m)   Wt 242 lb (109.8 kg)   LMP 09/21/2016 (LMP Unknown)   SpO2 99%   BMI 35.74 kg/m   Assessment and Plan:

## 2016-11-24 ENCOUNTER — Encounter: Payer: Self-pay | Admitting: Internal Medicine

## 2016-12-30 ENCOUNTER — Encounter: Payer: Self-pay | Admitting: Internal Medicine

## 2017-03-02 ENCOUNTER — Encounter: Payer: Self-pay | Admitting: Internal Medicine

## 2017-03-02 ENCOUNTER — Ambulatory Visit (INDEPENDENT_AMBULATORY_CARE_PROVIDER_SITE_OTHER): Payer: BLUE CROSS/BLUE SHIELD | Admitting: Internal Medicine

## 2017-03-02 VITALS — BP 140/82 | HR 88 | Temp 98.4°F | Ht 69.0 in | Wt 252.4 lb

## 2017-03-02 DIAGNOSIS — B3749 Other urogenital candidiasis: Secondary | ICD-10-CM | POA: Diagnosis not present

## 2017-03-02 DIAGNOSIS — R300 Dysuria: Secondary | ICD-10-CM | POA: Diagnosis not present

## 2017-03-02 LAB — POC URINALYSIS WITH MICROSCOPIC (NON AUTO)MANUAL RESULT
Bilirubin, UA: NEGATIVE
Crystals: 0
Epithelial cells, urine per micros: 2
Glucose, UA: NEGATIVE
Ketones, UA: NEGATIVE
Mucus, UA: 0
Nitrite, UA: NEGATIVE
Protein, UA: NEGATIVE
RBC: 2 M/uL — AB (ref 4.04–5.48)
Spec Grav, UA: <=1.005 — AB (ref 1.010–1.025)
Urobilinogen, UA: 0.2 E.U./dL
WBC Casts, UA: 0
pH, UA: 6 (ref 5.0–8.0)

## 2017-03-02 MED ORDER — FLUCONAZOLE 100 MG PO TABS: 100.0000 mg | ORAL_TABLET | Freq: Every day | ORAL | 0 refills | Status: DC

## 2017-03-02 NOTE — Progress Notes (Signed)
Date:  03/02/2017   Name:  Deanna Potts   DOB:  July 22, 1976   MRN:  [id number]   Chief Complaint: Urinary Tract Infection (Started Friday- and woke up in middle of night. Felt flushed- had excessive urination and back was uncomfortable. Occasional trickling urination and then other times excessive urination. ) Urinary Tract Infection   This is a recurrent problem. The current episode started in the past 7 days. The problem has been unchanged. The pain is mild. There has been no fever. Associated symptoms include frequency and urgency. She has tried increased fluids for the symptoms. The treatment provided no relief.    Review of Systems  Constitutional: Positive for diaphoresis. Negative for fever.  Respiratory: Negative for chest tightness and shortness of breath.   Cardiovascular: Negative for chest pain and palpitations.  Genitourinary: Positive for frequency and urgency.    Patient Active Problem List   Diagnosis Date Noted  . Hematuria, undiagnosed cause 05/30/2016  . Environmental and seasonal allergies 02/08/2016  . Essential hypertension 02/08/2016  . Family history of colon cancer in mother 08/06/2015  . Hyperlipidemia 08/16/2007  . ALLERGIC RHINITIS 08/16/2007  . Dysmenorrhea 08/16/2007  . LIVER FUNCTION TESTS, ABNORMAL 08/16/2007    Prior to Admission medications   Medication Sig Start Date End Date Taking? Authorizing Provider  amLODipine (NORVASC) 10 MG tablet Take 10 mg by mouth daily. 01/18/16  Yes Historical Provider, MD  cetirizine (ZYRTEC) 10 MG tablet Take 10 mg by mouth daily.   Yes Historical Provider, MD  Multiple Vitamin (MULTIVITAMIN) capsule Take 1 capsule by mouth daily.   Yes Historical Provider, MD    Allergies  Allergen Reactions  . Betadine [Povidone Iodine]   . Ciprofloxacin Hives  . Neomycin-Bacitracin Zn-Polymyx     REACTION: ? reaction    Past Surgical History:  Procedure Laterality Date  . CHOLECYSTECTOMY  2007  . COLONOSCOPY   07/2015  . GANGLION CYST EXCISION     Cyst    Social History  Substance Use Topics  . Smoking status: Former Smoker    Quit date: 11/17/2004  . Smokeless tobacco: Never Used  . Alcohol use No     Medication list has been reviewed and updated.   Physical Exam  Constitutional: She is oriented to person, place, and time. She appears well-developed. No distress.  HENT:  Head: Normocephalic and atraumatic.  Neck: Normal range of motion. Neck supple.  Cardiovascular: Normal rate, regular rhythm and normal heart sounds.   Pulmonary/Chest: Effort normal and breath sounds normal. No respiratory distress. She has no wheezes.  Abdominal: Soft. Bowel sounds are normal. There is no tenderness. There is no CVA tenderness.  Musculoskeletal: She exhibits no edema or tenderness.  Neurological: She is alert and oriented to person, place, and time.  Skin: Skin is warm and dry. No rash noted.  Psychiatric: She has a normal mood and affect. Her behavior is normal. Thought content normal.  Nursing note and vitals reviewed.   BP 140/82 (BP Location: Right Arm, Patient Position: Sitting, Cuff Size: Large)   Pulse 88   Temp 98.4 F (36.9 C)   Ht  (1.753 m)   Wt 252 lb 6.4 oz (114.5 kg)   LMP 02/27/2017 Comment: on last day of cycle today.  SpO2 99%   BMI 37.27 kg/m   Assessment and Plan: 1. Dysuria Continue increased fluids Take Bactrim Rx given previously - POC urinalysis w microscopic (non auto)  2. Yeast UTI Take diflucan as prescribed  Meds ordered this encounter  Medications  . fluconazole (DIFLUCAN) 100 MG tablet    Sig: Take 1 tablet (100 mg total) by mouth daily.    Dispense:  3 tablet    Refill:  0    Bari Edward, MD [facility]  03/02/2017

## 2017-04-16 ENCOUNTER — Encounter: Payer: Self-pay | Admitting: Internal Medicine

## 2017-04-17 NOTE — Telephone Encounter (Signed)
Pt requesting Advice.

## 2017-06-17 ENCOUNTER — Encounter: Payer: Self-pay | Admitting: Internal Medicine

## 2017-07-07 DIAGNOSIS — Z6838 Body mass index (BMI) 38.0-38.9, adult: Secondary | ICD-10-CM | POA: Diagnosis not present

## 2017-07-07 DIAGNOSIS — Z01419 Encounter for gynecological examination (general) (routine) without abnormal findings: Secondary | ICD-10-CM | POA: Diagnosis not present

## 2017-07-07 DIAGNOSIS — Z1231 Encounter for screening mammogram for malignant neoplasm of breast: Secondary | ICD-10-CM | POA: Diagnosis not present

## 2017-07-07 DIAGNOSIS — Z1212 Encounter for screening for malignant neoplasm of rectum: Secondary | ICD-10-CM | POA: Diagnosis not present

## 2017-07-09 ENCOUNTER — Encounter: Payer: Self-pay | Admitting: Internal Medicine

## 2017-07-09 NOTE — Telephone Encounter (Signed)
Mychart msg- Please Advise.

## 2017-07-10 NOTE — Telephone Encounter (Signed)
MyChart reply

## 2017-08-21 DIAGNOSIS — Z23 Encounter for immunization: Secondary | ICD-10-CM | POA: Diagnosis not present

## 2017-10-20 ENCOUNTER — Encounter: Payer: Self-pay | Admitting: Internal Medicine

## 2017-12-27 ENCOUNTER — Encounter: Payer: Self-pay | Admitting: Internal Medicine

## 2018-02-12 DIAGNOSIS — N912 Amenorrhea, unspecified: Secondary | ICD-10-CM | POA: Diagnosis not present

## 2018-03-25 ENCOUNTER — Encounter: Payer: Self-pay | Admitting: Internal Medicine

## 2018-05-03 ENCOUNTER — Encounter: Payer: BLUE CROSS/BLUE SHIELD | Admitting: Internal Medicine

## 2018-06-10 ENCOUNTER — Encounter: Payer: BLUE CROSS/BLUE SHIELD | Admitting: Internal Medicine

## 2018-08-31 ENCOUNTER — Encounter: Payer: Self-pay | Admitting: Emergency Medicine

## 2018-08-31 ENCOUNTER — Ambulatory Visit
Admission: EM | Admit: 2018-08-31 | Discharge: 2018-08-31 | Disposition: A | Discharge status: Home / Self Care | Payer: BLUE CROSS/BLUE SHIELD | Attending: Family Medicine | Admitting: Family Medicine

## 2018-08-31 ENCOUNTER — Other Ambulatory Visit: Payer: Self-pay

## 2018-08-31 DIAGNOSIS — N390 Urinary tract infection, site not specified: Secondary | ICD-10-CM

## 2018-08-31 DIAGNOSIS — R319 Hematuria, unspecified: Secondary | ICD-10-CM

## 2018-08-31 LAB — URINALYSIS, COMPLETE (UACMP) WITH MICROSCOPIC
Bilirubin Urine: NEGATIVE
Glucose, UA: NEGATIVE mg/dL
Ketones, ur: NEGATIVE mg/dL
Nitrite: NEGATIVE
Protein, ur: 100 mg/dL — AB
Specific Gravity, Urine: 1.01 (ref 1.005–1.030)
WBC, UA: >50 WBC/hpf (ref 0–5)
pH: 6.5 (ref 5.0–8.0)

## 2018-08-31 MED ORDER — CEPHALEXIN 500 MG PO CAPS: 500.0000 mg | ORAL_CAPSULE | Freq: Two times a day (BID) | ORAL | 0 refills | Status: AC

## 2018-08-31 NOTE — ED Provider Notes (Signed)
[facility] ____________________________________________  Time seen: Approximately 8:42 AM  I have reviewed the triage vital signs and the nursing notes.   HISTORY  Chief Complaint Dysuria  HPI Deanna Potts is a 42 y.o. female presented for evaluation of urinary frequency, urinary urgency, burning with urination and blood in urine since this morning.  States that she woke up at 3 AM and had some chills, no known fevers.  Reports some dull pressure low back pain, no other back pain or flank pain.  No fevers, vomiting, bowel changes, chest pain or shortness of breath, vaginal discharge, vaginal irritation or sores.  Continues to eat and drink well.  States just recently came home from a beach trip and reports that may be a possible trigger.  No over-the-counter medications taken with the same complaints.  Denies other aggravating alleviating factors.  Reports otherwise feels well.  Denies recent antibodies.  Reubin Milan, MD: PCP   Past Medical History:  Diagnosis Date  . Abnormal liver function   . Allergic rhinitis   . Anemia, iron deficiency   . Cholelithiasis   . Conjunctivitis   . Dysmenorrhea   . Ganglion cyst   . Hemorrhoids   . HTN (hypertension)   . Hyperlipidemia   . Miscarriage 2008  . Obesity     Patient Active Problem List   Diagnosis Date Noted  . Hematuria, undiagnosed cause 05/30/2016  . Environmental and seasonal allergies 02/08/2016  . Essential hypertension 02/08/2016  . Family history of colon cancer in mother 08/06/2015  . Hyperlipidemia 08/16/2007  . ALLERGIC RHINITIS 08/16/2007  . Dysmenorrhea 08/16/2007  . LIVER FUNCTION TESTS, ABNORMAL 08/16/2007    Past Surgical History:  Procedure Laterality Date  . CHOLECYSTECTOMY  2007  . COLONOSCOPY  07/2015  . GANGLION CYST EXCISION     Cyst     No current facility-administered medications for this encounter.   Current Outpatient Medications:  .  amLODipine (NORVASC) 10 MG  tablet, Take 10 mg by mouth daily., Disp: , Rfl: 12 .  cetirizine (ZYRTEC) 10 MG tablet, Take 10 mg by mouth daily., Disp: , Rfl:  .  Multiple Vitamin (MULTIVITAMIN) capsule, Take 1 capsule by mouth daily., Disp: , Rfl:  .  cephALEXin (KEFLEX) 500 MG capsule, Take 1 capsule (500 mg total) by mouth 2 (two) times daily for 7 days., Disp: 14 capsule, Rfl: 0 .  fluconazole (DIFLUCAN) 100 MG tablet, Take 1 tablet (100 mg total) by mouth daily., Disp: 3 tablet, Rfl: 0  Allergies Betadine [povidone iodine]; Ciprofloxacin; and Neomycin-bacitracin zn-polymyx  Family History  Problem Relation Age of Onset  . Colon cancer Mother        died at 20  . Diabetes Father   . Heart disease Father   . Aneurysm Father   . Colon polyps Maternal Grandmother        benign but still had colectomy  . Heart disease Paternal Grandfather   . Kidney disease Neg Hx   . Bladder Cancer Neg Hx     Social History Social History   Tobacco Use  . Smoking status: Former Smoker    Last attempt to quit: 11/17/2004    Years since quitting: 13.7  . Smokeless tobacco: Never Used  Substance Use Topics  . Alcohol use: No    Alcohol/week: 0.0 standard drinks  . Drug use: No    Review of Systems Constitutional: No fever Cardiovascular: Denies chest pain. Respiratory: Denies shortness of breath. Gastrointestinal: No abdominal pain.  No nausea, no vomiting.  No diarrhea.   Genitourinary: positive for dysuria. Musculoskeletal: positive for back pain. Skin: Negative for rash.   ____________________________________________   PHYSICAL EXAM:  VITAL SIGNS: ED Triage Vitals  Enc Vitals Group     BP 08/31/18 0831 128/81     Pulse Rate 08/31/18 0831 86     Resp 08/31/18 0831 17     Temp 08/31/18 0831 99.1 F (37.3 C)     Temp Source 08/31/18 0831 Oral     SpO2 08/31/18 0831 99 %     Weight 08/31/18 0829 250 lb (113.4 kg)     Height 08/31/18 0829 5\' 8"  (1.727 m)     Head Circumference --      Peak Flow --       Pain Score 08/31/18 0827 4     Pain Loc --      Pain Edu? --      Excl. in GC? --     Constitutional: Alert and oriented. Well appearing and in no acute distress. ENT      Head: Normocephalic and atraumatic. Cardiovascular: Normal rate, regular rhythm. Grossly normal heart sounds.  Good peripheral circulation. Respiratory: Normal respiratory effort without tachypnea nor retractions. Breath sounds are clear and equal bilaterally. No wheezes, rales, rhonchi. Gastrointestinal: Soft and nontender.No CVA tenderness. Musculoskeletal:  No midline cervical, thoracic or lumbar tenderness to palpation. Neurologic:  Normal speech and language. Speech is normal. No gait instability.  Skin:  Skin is warm, dry and intact. No rash noted. Psychiatric: Mood and affect are normal. Speech and behavior are normal. Patient exhibits appropriate insight and judgment   ___________________________________________   LABS (all labs ordered are listed, but only abnormal results are displayed)  Labs Reviewed  URINALYSIS, COMPLETE (UACMP) WITH MICROSCOPIC - Abnormal; Notable for the following components:      Result Value   Color, Urine AMBER (*)    APPearance HAZY (*)    Hgb urine dipstick LARGE (*)    Protein, ur 100 (*)    Leukocytes, UA MODERATE (*)    Bacteria, UA FEW (*)    All other components within normal limits  URINE CULTURE    PROCEDURES Procedures   INITIAL IMPRESSION / ASSESSMENT AND PLAN / ED COURSE  Pertinent labs & imaging results that were available during my care of the patient were reviewed by me and considered in my medical decision making (see chart for details).  Well-appearing patient.  No acute distress.  Urinalysis as above.  Will treat with oral Keflex.  We will culture.  Encourage rest, fluids, supportive care.Discussed indication, risks and benefits of medications with patient.  Discussed follow up with Primary care physician this week as needed. Discussed follow up and  return parameters including no resolution or any worsening concerns. Patient verbalized understanding and agreed to plan.   ____________________________________________   FINAL CLINICAL IMPRESSION(S) / ED DIAGNOSES  Final diagnoses:  Urinary tract infection with hematuria, site unspecified     ED Discharge Orders         Ordered    cephALEXin (KEFLEX) 500 MG capsule  2 times daily     08/31/18 0857           Note: This dictation was prepared with Dragon dictation along with smaller phrase technology. Any transcriptional errors that result from this process are unintentional.         Renford Dills, NP 08/31/18 0900

## 2018-08-31 NOTE — Discharge Instructions (Addendum)
Take medication as prescribed. Rest. Drink plenty of fluids.  ° °Follow up with your primary care physician this week as needed. Return to Urgent care for new or worsening concerns.  ° °

## 2018-08-31 NOTE — ED Triage Notes (Signed)
Pt c/o dysuria, hematuria, lower back pain, frequency, and urinary retention. Started early this morning.

## 2018-09-01 LAB — URINE CULTURE

## 2018-10-06 DIAGNOSIS — Z6839 Body mass index (BMI) 39.0-39.9, adult: Secondary | ICD-10-CM | POA: Diagnosis not present

## 2018-10-06 DIAGNOSIS — Z01419 Encounter for gynecological examination (general) (routine) without abnormal findings: Secondary | ICD-10-CM | POA: Diagnosis not present

## 2018-10-06 DIAGNOSIS — Z1231 Encounter for screening mammogram for malignant neoplasm of breast: Secondary | ICD-10-CM | POA: Diagnosis not present

## 2018-10-06 DIAGNOSIS — Z1212 Encounter for screening for malignant neoplasm of rectum: Secondary | ICD-10-CM | POA: Diagnosis not present

## 2018-10-08 LAB — HM PAP SMEAR: HM Pap smear: NEGATIVE

## 2018-10-11 ENCOUNTER — Other Ambulatory Visit: Payer: Self-pay | Admitting: Obstetrics and Gynecology

## 2018-10-11 DIAGNOSIS — R928 Other abnormal and inconclusive findings on diagnostic imaging of breast: Secondary | ICD-10-CM

## 2018-10-18 ENCOUNTER — Ambulatory Visit
Admission: RE | Admit: 2018-10-18 | Discharge: 2018-10-18 | Disposition: A | Discharge status: Home / Self Care | Payer: BLUE CROSS/BLUE SHIELD | Source: Ambulatory Visit | Attending: Obstetrics and Gynecology | Admitting: Obstetrics and Gynecology

## 2018-10-18 DIAGNOSIS — N6011 Diffuse cystic mastopathy of right breast: Secondary | ICD-10-CM | POA: Diagnosis not present

## 2018-10-18 DIAGNOSIS — R928 Other abnormal and inconclusive findings on diagnostic imaging of breast: Secondary | ICD-10-CM

## 2018-11-01 ENCOUNTER — Encounter: Payer: Self-pay | Admitting: Internal Medicine

## 2018-11-05 ENCOUNTER — Ambulatory Visit (INDEPENDENT_AMBULATORY_CARE_PROVIDER_SITE_OTHER): Payer: BLUE CROSS/BLUE SHIELD | Admitting: Internal Medicine

## 2018-11-05 ENCOUNTER — Encounter: Payer: Self-pay | Admitting: Internal Medicine

## 2018-11-05 VITALS — BP 130/68 | HR 74 | Ht 68.0 in | Wt 258.0 lb

## 2018-11-05 DIAGNOSIS — R7303 Prediabetes: Secondary | ICD-10-CM

## 2018-11-05 DIAGNOSIS — Z0000 Encounter for general adult medical examination without abnormal findings: Secondary | ICD-10-CM

## 2018-11-05 DIAGNOSIS — I10 Essential (primary) hypertension: Secondary | ICD-10-CM

## 2018-11-05 DIAGNOSIS — Z1231 Encounter for screening mammogram for malignant neoplasm of breast: Secondary | ICD-10-CM

## 2018-11-05 DIAGNOSIS — E782 Mixed hyperlipidemia: Secondary | ICD-10-CM

## 2018-11-05 LAB — POCT URINALYSIS DIPSTICK
Bilirubin, UA: NEGATIVE
Glucose, UA: NEGATIVE
Ketones, UA: NEGATIVE
Nitrite, UA: NEGATIVE
Protein, UA: NEGATIVE
Spec Grav, UA: >=1.03 — AB (ref 1.010–1.025)
Urobilinogen, UA: 0.2 E.U./dL
pH, UA: 6 (ref 5.0–8.0)

## 2018-11-05 NOTE — Patient Instructions (Signed)

## 2018-11-05 NOTE — Progress Notes (Signed)
Date:  11/05/2018   Name:  Deanna Potts   DOB:  Apr 01, 1976   MRN:  [id number]   Chief Complaint: Annual Exam; Hypertension; and Hyperlipidemia Deanna Potts is a 42 y.o. female who presents today for her Complete Annual Exam. She feels well. She reports exercising none. She reports she is sleeping well. She sees GYN for Pap and pelvic exams.  She recently had a mammogram - US follow up was benign.  Hypertension  This is a chronic problem. The problem is controlled. Pertinent negatives include no chest pain, headaches, palpitations or shortness of breath. Past treatments include calcium channel blockers. The current treatment provides significant improvement. Compliance problems include exercise.   Hyperlipidemia  Recent lipid tests were reviewed and are variable. Pertinent negatives include no chest pain or shortness of breath.  Hematuria - has had complete work up with Urology.  She still has urinary sx at times but overall improved.   Review of Systems  Constitutional: Negative for chills, fatigue and fever.  HENT: Negative for congestion, hearing loss, tinnitus, trouble swallowing and voice change.   Eyes: Negative for visual disturbance.  Respiratory: Negative for cough, chest tightness, shortness of breath and wheezing.   Cardiovascular: Positive for leg swelling. Negative for chest pain and palpitations.  Gastrointestinal: Negative for abdominal pain, constipation, diarrhea and vomiting.  Endocrine: Negative for polydipsia and polyuria.  Genitourinary: Negative for dysuria, frequency, genital sores, vaginal bleeding and vaginal discharge.  Musculoskeletal: Negative for arthralgias, gait problem and joint swelling.  Skin: Negative for color change and rash.  Neurological: Negative for dizziness, tremors, light-headedness and headaches.  Hematological: Negative for adenopathy. Does not bruise/bleed easily.  Psychiatric/Behavioral: Negative for dysphoric mood and sleep  disturbance. The patient is not nervous/anxious.     Patient Active Problem List   Diagnosis Date Noted  . Mixed hyperlipidemia 11/05/2018  . Hematuria, undiagnosed cause 05/30/2016  . Environmental and seasonal allergies 02/08/2016  . Essential hypertension 02/08/2016  . Family history of colon cancer in mother 08/06/2015  . ALLERGIC RHINITIS 08/16/2007  . Dysmenorrhea 08/16/2007    Allergies  Allergen Reactions  . Betadine [Povidone Iodine]   . Ciprofloxacin Hives  . Neomycin-Bacitracin Zn-Polymyx     REACTION: ? reaction    Past Surgical History:  Procedure Laterality Date  . CHOLECYSTECTOMY  2007  . COLONOSCOPY  07/2015  . GANGLION CYST EXCISION     Cyst    Social History   Tobacco Use  . Smoking status: Former Smoker    Last attempt to quit: 11/17/2004    Years since quitting: 13.9  . Smokeless tobacco: Never Used  Substance Use Topics  . Alcohol use: No    Alcohol/week: 0.0 standard drinks  . Drug use: No     Medication list has been reviewed and updated.  Current Meds  Medication Sig  . amLODipine (NORVASC) 10 MG tablet Take 10 mg by mouth daily.  Marland Kitchen. loratadine (CLARITIN) 10 MG tablet Take 10 mg by mouth daily.  . Multiple Vitamin (MULTIVITAMIN) capsule Take 1 capsule by mouth daily.    PHQ 2/9 Scores 11/05/2018 05/30/2016  PHQ - 2 Score 0 0    Physical Exam Vitals signs and nursing note reviewed.  Constitutional:      General: She is not in acute distress.    Appearance: She is well-developed.  HENT:     Head: Normocephalic and atraumatic.     Right Ear: Tympanic membrane and ear canal normal.  Left Ear: Tympanic membrane and ear canal normal.     Nose:     Right Sinus: No maxillary sinus tenderness.     Left Sinus: No maxillary sinus tenderness.     Mouth/Throat:     Pharynx: Uvula midline.  Eyes:     General: No scleral icterus.       Right eye: No discharge.        Left eye: No discharge.     Conjunctiva/sclera: Conjunctivae normal.   Neck:     Musculoskeletal: Normal range of motion. No erythema.     Thyroid: No thyromegaly.     Vascular: No carotid bruit.  Cardiovascular:     Rate and Rhythm: Normal rate and regular rhythm.     Pulses: Normal pulses.     Heart sounds: Normal heart sounds.  Pulmonary:     Effort: Pulmonary effort is normal. No respiratory distress.     Breath sounds: No wheezing.  Abdominal:     General: Bowel sounds are normal.     Palpations: Abdomen is soft.     Tenderness: There is no abdominal tenderness.  Musculoskeletal: Normal range of motion.     Right lower leg: Edema (1+) present.     Left lower leg: Edema (1+) present.  Lymphadenopathy:     Cervical: No cervical adenopathy.  Skin:    General: Skin is warm and dry.     Findings: No rash.  Neurological:     Mental Status: She is alert and oriented to person, place, and time.     Cranial Nerves: No cranial nerve deficit.     Sensory: No sensory deficit.     Deep Tendon Reflexes: Reflexes are normal and symmetric.  Psychiatric:        Speech: Speech normal.        Behavior: Behavior normal.        Thought Content: Thought content normal.    Wt Readings from Last 3 Encounters:  11/05/18 258 lb (117 kg)  08/31/18 250 lb (113.4 kg)  03/02/17 252 lb 6.4 oz (114.5 kg)    BP 130/68 (BP Location: Right Arm, Patient Position: Sitting, Cuff Size: Large)   Pulse 74   Ht 5\' 8"  (1.727 m)   Wt 258 lb (117 kg)   LMP 11/02/2018 (Exact Date)   SpO2 98%   BMI 39.23 kg/m   Assessment and Plan: 1. Annual physical exam Encourage regular exercise - POCT urinalysis dipstick  2. Encounter for screening mammogram for breast cancer Completed recently  3. Essential hypertension Controlled, mild edema noted May need to change therapy if worsening - CBC with Differential/Platelet - Comprehensive metabolic panel - TSH  4. Mixed hyperlipidemia - Lipid panel  5. Pre-diabetes - Hemoglobin A1c   Partially dictated using [employer]  software. Any errors are unintentional.  Bari EdwardLaura Hadasah Brugger, MD [facility]  11/05/2018

## 2018-11-06 LAB — CBC WITH DIFFERENTIAL/PLATELET
Basophils Absolute: 0.1 10*3/uL (ref 0.0–0.2)
Basos: 1 %
EOS (ABSOLUTE): 0.3 10*3/uL (ref 0.0–0.4)
Eos: 3 %
Hematocrit: 41 % (ref 34.0–46.6)
Hemoglobin: 13.3 g/dL (ref 11.1–15.9)
Immature Grans (Abs): 0 10*3/uL (ref 0.0–0.1)
Immature Granulocytes: 0 %
Lymphocytes Absolute: 2.6 10*3/uL (ref 0.7–3.1)
Lymphs: 28 %
MCH: 26.3 pg — ABNORMAL LOW (ref 26.6–33.0)
MCHC: 32.4 g/dL (ref 31.5–35.7)
MCV: 81 fL (ref 79–97)
Monocytes Absolute: 0.5 10*3/uL (ref 0.1–0.9)
Monocytes: 6 %
Neutrophils Absolute: 5.9 10*3/uL (ref 1.4–7.0)
Neutrophils: 62 %
Platelets: 414 10*3/uL (ref 150–450)
RBC: 5.06 x10E6/uL (ref 3.77–5.28)
RDW: 13.6 % (ref 12.3–15.4)
WBC: 9.5 10*3/uL (ref 3.4–10.8)

## 2018-11-06 LAB — COMPREHENSIVE METABOLIC PANEL
ALT: 15 IU/L (ref 0–32)
AST: 7 IU/L (ref 0–40)
Albumin/Globulin Ratio: 1.4 (ref 1.2–2.2)
Albumin: 4.5 g/dL (ref 3.5–5.5)
Alkaline Phosphatase: 92 IU/L (ref 39–117)
BUN/Creatinine Ratio: 11 (ref 9–23)
BUN: 8 mg/dL (ref 6–24)
Bilirubin Total: 0.3 mg/dL (ref 0.0–1.2)
CO2: 22 mmol/L (ref 20–29)
Calcium: 9.6 mg/dL (ref 8.7–10.2)
Chloride: 101 mmol/L (ref 96–106)
Creatinine, Ser: 0.76 mg/dL (ref 0.57–1.00)
GFR calc Af Amer: 111 mL/min/{1.73_m2} (ref >59)
GFR calc non Af Amer: 96 mL/min/{1.73_m2} (ref >59)
Globulin, Total: 3.2 g/dL (ref 1.5–4.5)
Glucose: 99 mg/dL (ref 65–99)
Potassium: 4.1 mmol/L (ref 3.5–5.2)
Sodium: 141 mmol/L (ref 134–144)
Total Protein: 7.7 g/dL (ref 6.0–8.5)

## 2018-11-06 LAB — HEMOGLOBIN A1C
Est. average glucose Bld gHb Est-mCnc: 123 mg/dL
Hgb A1c MFr Bld: 5.9 % — ABNORMAL HIGH (ref 4.8–5.6)

## 2018-11-06 LAB — LIPID PANEL
Chol/HDL Ratio: 4.4 ratio (ref 0.0–4.4)
Cholesterol, Total: 206 mg/dL — ABNORMAL HIGH (ref 100–199)
HDL: 47 mg/dL (ref >39)
LDL Calculated: 138 mg/dL — ABNORMAL HIGH (ref 0–99)
Triglycerides: 106 mg/dL (ref 0–149)
VLDL Cholesterol Cal: 21 mg/dL (ref 5–40)

## 2018-11-06 LAB — TSH: TSH: 1 u[IU]/mL (ref 0.450–4.500)

## 2018-11-07 ENCOUNTER — Encounter: Payer: Self-pay | Admitting: Internal Medicine

## 2018-11-07 DIAGNOSIS — R7303 Prediabetes: Secondary | ICD-10-CM | POA: Insufficient documentation

## 2018-11-24 ENCOUNTER — Encounter: Payer: Self-pay | Admitting: Internal Medicine

## 2018-11-25 ENCOUNTER — Other Ambulatory Visit: Payer: Self-pay

## 2018-11-25 MED ORDER — AMLODIPINE BESYLATE 10 MG PO TABS: 10.0000 mg | ORAL_TABLET | Freq: Every day | ORAL | 1 refills | Status: DC

## 2018-11-25 NOTE — Progress Notes (Signed)
Patient requested 90 days supply sent to new pharmacy- [facility]. Sent medications in and informed patient. She said she must use [facility] or the Mail Order per her new insurance this year.

## 2018-12-01 ENCOUNTER — Encounter: Payer: Self-pay | Admitting: Emergency Medicine

## 2018-12-01 ENCOUNTER — Ambulatory Visit
Admission: EM | Admit: 2018-12-01 | Discharge: 2018-12-01 | Disposition: A | Discharge status: Home / Self Care | Payer: BLUE CROSS/BLUE SHIELD | Attending: Family Medicine | Admitting: Family Medicine

## 2018-12-01 ENCOUNTER — Other Ambulatory Visit: Payer: Self-pay

## 2018-12-01 DIAGNOSIS — B9689 Other specified bacterial agents as the cause of diseases classified elsewhere: Secondary | ICD-10-CM

## 2018-12-01 DIAGNOSIS — R319 Hematuria, unspecified: Secondary | ICD-10-CM | POA: Diagnosis not present

## 2018-12-01 DIAGNOSIS — N390 Urinary tract infection, site not specified: Secondary | ICD-10-CM | POA: Diagnosis not present

## 2018-12-01 LAB — URINALYSIS, COMPLETE (UACMP) WITH MICROSCOPIC
Bilirubin Urine: NEGATIVE
Glucose, UA: NEGATIVE mg/dL
Ketones, ur: NEGATIVE mg/dL
Nitrite: NEGATIVE
Protein, ur: NEGATIVE mg/dL
Specific Gravity, Urine: 1.02 (ref 1.005–1.030)
pH: 5.5 (ref 5.0–8.0)

## 2018-12-01 MED ORDER — CEPHALEXIN 500 MG PO CAPS: 500.0000 mg | ORAL_CAPSULE | Freq: Two times a day (BID) | ORAL | 0 refills | Status: AC

## 2018-12-01 MED ORDER — FLUCONAZOLE 150 MG PO TABS: 150.0000 mg | ORAL_TABLET | Freq: Every day | ORAL | 0 refills | Status: DC

## 2018-12-01 NOTE — Discharge Instructions (Addendum)
Take medication as prescribed. Rest. Drink plenty of fluids.  ° °Follow up with your primary care physician this week as needed. Return to Urgent care for new or worsening concerns.  ° °

## 2018-12-01 NOTE — ED Provider Notes (Signed)
[facility] ____________________________________________  Time seen: Approximately 8:57 AM  I have reviewed the triage vital signs and the nursing notes.   HISTORY  Chief Complaint Urinary Frequency and Dysuria   HPI Deanna Potts is a 43 y.o. female presenting for evaluation of urinary frequency, urinary urgency and some discomfort with urination since yesterday.  Patient reports history of similar, particularly after not drinking as much water, and reports in the last few days she is now aware that she does not drink as much water.  States started increasing her water intake yesterday and today.  Denies any vaginal discharge, vaginal discomfort or abdominal pain.  States she does have some aching low back pain bilaterally.  No accompanying fevers.  Has continued to eat and drink well.  States had similar back in October and was treated for UTI and symptoms resolved completely.  Denies other known triggers.  Reports otherwise doing well denies other recent sickness.  Reubin MilanBerglund, Laura H, MD: PCP Patient's last menstrual period was 11/25/2018.denies pregnancy.     Past Medical History:  Diagnosis Date  . Abnormal liver function   . Allergic rhinitis   . Anemia, iron deficiency   . Cholelithiasis   . Conjunctivitis   . Dysmenorrhea   . Ganglion cyst   . Hemorrhoids   . HTN (hypertension)   . Hyperlipidemia   . LIVER FUNCTION TESTS, ABNORMAL 08/16/2007   Resolved after cholecystectomy   . Miscarriage 2008  . Obesity     Patient Active Problem List   Diagnosis Date Noted  . Pre-diabetes 11/07/2018  . Mixed hyperlipidemia 11/05/2018  . Hematuria, undiagnosed cause 05/30/2016  . Environmental and seasonal allergies 02/08/2016  . Essential hypertension 02/08/2016  . Family history of colon cancer in mother 08/06/2015  . ALLERGIC RHINITIS 08/16/2007  . Dysmenorrhea 08/16/2007    Past Surgical History:  Procedure Laterality Date  . CHOLECYSTECTOMY  2007    . COLONOSCOPY  07/2015  . GANGLION CYST EXCISION     Cyst     No current facility-administered medications for this encounter.   Current Outpatient Medications:  .  amLODipine (NORVASC) 10 MG tablet, Take 1 tablet (10 mg total) by mouth daily., Disp: 90 tablet, Rfl: 1 .  loratadine (CLARITIN) 10 MG tablet, Take 10 mg by mouth daily., Disp: , Rfl:  .  Multiple Vitamin (MULTIVITAMIN) capsule, Take 1 capsule by mouth daily., Disp: , Rfl:  .  cephALEXin (KEFLEX) 500 MG capsule, Take 1 capsule (500 mg total) by mouth 2 (two) times daily for 7 days., Disp: 14 capsule, Rfl: 0 .  fluconazole (DIFLUCAN) 150 MG tablet, Take 1 tablet (150 mg total) by mouth daily. Take one pill orally once, Disp: 1 tablet, Rfl: 0  Allergies Betadine [povidone iodine]; Ciprofloxacin; and Neomycin-bacitracin zn-polymyx  Family History  Problem Relation Age of Onset  . Colon cancer Mother        died at 7560  . Diabetes Father   . Heart disease Father   . Aneurysm Father   . Colon polyps Maternal Grandmother        benign but still had colectomy  . Heart disease Paternal Grandfather   . Kidney disease Neg Hx   . Bladder Cancer Neg Hx   . Breast cancer Neg Hx     Social History Social History   Tobacco Use  . Smoking status: Former Smoker    Last attempt to quit: 11/17/2004    Years since quitting: 14.0  . Smokeless tobacco: Never  Used  Substance Use Topics  . Alcohol use: No    Alcohol/week: 0.0 standard drinks  . Drug use: No    Review of Systems Constitutional: No fever Cardiovascular: Denies chest pain. Respiratory: Denies shortness of breath. Gastrointestinal: No abdominal pain.  No nausea, no vomiting.  Genitourinary: positive for dysuria. Musculoskeletal: positive for back pain. Skin: Negative for rash.  ____________________________________________   PHYSICAL EXAM:  VITAL SIGNS: ED Triage Vitals  Enc Vitals Group     BP 12/01/18 0811 (!) 146/86     Pulse Rate 12/01/18 0811 81      Resp 12/01/18 0811 18     Temp 12/01/18 0811 98.4 F (36.9 C)     Temp Source 12/01/18 0811 Oral     SpO2 12/01/18 0811 99 %     Weight 12/01/18 0812 258 lb (117 kg)     Height 12/01/18 0812 5\' 8"  (1.727 m)     Head Circumference --      Peak Flow --      Pain Score 12/01/18 0812 3     Pain Loc --      Pain Edu? --      Excl. in GC? --     Constitutional: Alert and oriented. Well appearing and in no acute distress. ENT      Head: Normocephalic and atraumatic. Cardiovascular: Normal rate, regular rhythm. Grossly normal heart sounds.  Good peripheral circulation. Respiratory: Normal respiratory effort without tachypnea nor retractions. Breath sounds are clear and equal bilaterally. No wheezes, rales, rhonchi. Gastrointestinal: Soft and nontender. No CVA tenderness. Musculoskeletal:  No midline cervical, thoracic or lumbar tenderness to palpation.  Neurologic:  Normal speech and language. Speech is normal. No gait instability.  Skin:  Skin is warm, dry  Psychiatric: Mood and affect are normal. Speech and behavior are normal. Patient exhibits appropriate insight and judgment   ___________________________________________   LABS (all labs ordered are listed, but only abnormal results are displayed)  Labs Reviewed  URINALYSIS, COMPLETE (UACMP) WITH MICROSCOPIC - Abnormal; Notable for the following components:      Result Value   APPearance HAZY (*)    Hgb urine dipstick MODERATE (*)    Leukocytes, UA MODERATE (*)    Bacteria, UA FEW (*)    All other components within normal limits  URINE CULTURE     PROCEDURES Procedures  _____________________________________   INITIAL IMPRESSION / ASSESSMENT AND PLAN / ED COURSE  Pertinent labs & imaging results that were available during my care of the patient were reviewed by me and considered in my medical decision making (see chart for details).  Well-appearing patient.  No acute distress.  Urinalysis reviewed, suspect UTI.  Will  empirically treat with oral Keflex.  Will culture urine.  Yeast noted, will Rx Diflucan as well.  Encourage rest, fluids, supportive care.  Discussed monitoring and avoidance of triggers.Discussed indication, risks and benefits of medications with patient.  Discussed follow up with Primary care physician this week. Discussed follow up and return parameters including no resolution or any worsening concerns. Patient verbalized understanding and agreed to plan.   ____________________________________________   FINAL CLINICAL IMPRESSION(S) / ED DIAGNOSES  Final diagnoses:  Urinary tract infection with hematuria, site unspecified     ED Discharge Orders         Ordered    cephALEXin (KEFLEX) 500 MG capsule  2 times daily     12/01/18 0846    fluconazole (DIFLUCAN) 150 MG tablet  Daily  12/01/18 0846           Note: This dictation was prepared with Dragon dictation along with smaller phrase technology. Any transcriptional errors that result from this process are unintentional.         Renford DillsMiller, Ilynn Stauffer, NP 12/01/18 0900

## 2018-12-01 NOTE — ED Triage Notes (Signed)
Patient c/o urinary frequency and dysuria that started yesterday.  

## 2018-12-02 LAB — URINE CULTURE

## 2018-12-23 ENCOUNTER — Encounter: Payer: Self-pay | Admitting: Internal Medicine

## 2018-12-23 ENCOUNTER — Other Ambulatory Visit: Payer: Self-pay

## 2018-12-23 ENCOUNTER — Ambulatory Visit: Payer: BLUE CROSS/BLUE SHIELD | Admitting: Internal Medicine

## 2018-12-23 VITALS — BP 136/84 | HR 91 | Ht 68.0 in | Wt 261.0 lb

## 2018-12-23 DIAGNOSIS — N3000 Acute cystitis without hematuria: Secondary | ICD-10-CM | POA: Diagnosis not present

## 2018-12-23 DIAGNOSIS — J069 Acute upper respiratory infection, unspecified: Secondary | ICD-10-CM

## 2018-12-23 LAB — POC URINALYSIS WITH MICROSCOPIC (NON AUTO)MANUAL RESULT
Bilirubin, UA: NEGATIVE
Crystals: 0
Epithelial cells, urine per micros: 0
Glucose, UA: NEGATIVE
Ketones, UA: NEGATIVE
Mucus, UA: 0
Nitrite, UA: POSITIVE
Protein, UA: NEGATIVE
RBC: 50 M/uL — AB (ref 4.04–5.48)
Spec Grav, UA: 1.01 (ref 1.010–1.025)
Urobilinogen, UA: 0.2 E.U./dL
WBC Casts, UA: 5
pH, UA: 6.5 (ref 5.0–8.0)

## 2018-12-23 MED ORDER — NITROFURANTOIN MONOHYD MACRO 100 MG PO CAPS: 100.0000 mg | ORAL_CAPSULE | Freq: Two times a day (BID) | ORAL | 0 refills | Status: DC

## 2018-12-23 NOTE — Patient Instructions (Signed)
Coridicin HBP

## 2018-12-23 NOTE — Progress Notes (Signed)
Date:  12/23/2018   Name:  Deanna Potts   DOB:  [birth year]   MRN:  [id number]   Chief Complaint: Urinary Tract Infection (Lower back pain. Frequency. ) and Sinusitis (X 3 weeks of sinus pain. Blood in mucous. Dizzziness/lightheadedness. )  Urinary Tract Infection   This is a recurrent problem. The problem occurs every urination. The quality of the pain is described as aching. The pain is mild. There has been no fever. Associated symptoms include frequency, hematuria and urgency. Pertinent negatives include no chills.  Sinusitis  This is a new problem. The current episode started 1 to 4 weeks ago. There has been no fever. Associated symptoms include congestion and sinus pressure. Pertinent negatives include no chills, coughing, ear pain or headaches. Past treatments include nothing.    Review of Systems  Constitutional: Negative for chills, fatigue and fever.  HENT: Positive for congestion and sinus pressure. Negative for ear pain and trouble swallowing.   Respiratory: Negative for cough and wheezing.   Cardiovascular: Negative for chest pain and palpitations.  Genitourinary: Positive for frequency, hematuria and urgency.  Neurological: Positive for light-headedness. Negative for dizziness and headaches.    Patient Active Problem List   Diagnosis Date Noted  . Pre-diabetes 11/07/2018  . Mixed hyperlipidemia 11/05/2018  . Hematuria, undiagnosed cause 05/30/2016  . Environmental and seasonal allergies 02/08/2016  . Essential hypertension 02/08/2016  . Family history of colon cancer in mother 08/06/2015  . ALLERGIC RHINITIS 08/16/2007  . Dysmenorrhea 08/16/2007    Allergies  Allergen Reactions  . Betadine [Povidone Iodine]   . Ciprofloxacin Hives  . Neomycin-Bacitracin Zn-Polymyx     REACTION: ? reaction    Past Surgical History:  Procedure Laterality Date  . CHOLECYSTECTOMY  2007  . COLONOSCOPY  07/2015  . GANGLION CYST EXCISION     Cyst    Social History    Tobacco Use  . Smoking status: Former Smoker    Last attempt to quit: 11/17/2004    Years since quitting: 14.1  . Smokeless tobacco: Never Used  Substance Use Topics  . Alcohol use: No    Alcohol/week: 0.0 standard drinks  . Drug use: No     Medication list has been reviewed and updated.  Current Meds  Medication Sig  . amLODipine (NORVASC) 10 MG tablet Take 1 tablet (10 mg total) by mouth daily.  Marland Kitchen loratadine (CLARITIN) 10 MG tablet Take 10 mg by mouth daily.  . Multiple Vitamin (MULTIVITAMIN) capsule Take 1 capsule by mouth daily.    PHQ 2/9 Scores 12/23/2018 11/05/2018 05/30/2016  PHQ - 2 Score 0 0 0    Physical Exam Constitutional:      Appearance: She is well-developed.  HENT:     Right Ear: Ear canal and external ear normal. Tympanic membrane is retracted. Tympanic membrane is not erythematous.     Left Ear: Ear canal and external ear normal. Tympanic membrane is not erythematous or retracted.     Nose:     Right Sinus: Maxillary sinus tenderness present. No frontal sinus tenderness.     Left Sinus: Maxillary sinus tenderness present. No frontal sinus tenderness.     Mouth/Throat:     Mouth: No oral lesions.     Pharynx: Uvula midline. No oropharyngeal exudate or posterior oropharyngeal erythema.  Cardiovascular:     Rate and Rhythm: Normal rate and regular rhythm.     Pulses: Normal pulses.     Heart sounds: Normal heart sounds.  Pulmonary:  Breath sounds: Normal breath sounds. No wheezing or rales.  Abdominal:     Tenderness: There is abdominal tenderness in the suprapubic area.  Lymphadenopathy:     Cervical: No cervical adenopathy.  Neurological:     Mental Status: She is alert and oriented to person, place, and time.     BP 136/84   Pulse 91   Ht 5\' 8"  (1.727 m)   Wt 261 lb (118.4 kg)   LMP 12/22/2018 (Exact Date)   SpO2 98%   BMI 39.68 kg/m   Assessment and Plan: 1. Viral URI Recommend Coricidin HBP  2. Acute cystitis without  hematuria Increase fluids - POC urinalysis w microscopic (non auto) - nitrofurantoin, macrocrystal-monohydrate, (MACROBID) 100 MG capsule; Take 1 capsule (100 mg total) by mouth 2 (two) times daily.  Dispense: 14 capsule; Refill: 0   Partially dictated using Animal nutritionist. Any errors are unintentional.  Bari Edward, MD [facility]  12/23/2018

## 2019-01-10 ENCOUNTER — Ambulatory Visit
Admission: EM | Admit: 2019-01-10 | Discharge: 2019-01-10 | Disposition: A | Discharge status: Home / Self Care | Payer: BLUE CROSS/BLUE SHIELD | Attending: Family Medicine | Admitting: Family Medicine

## 2019-01-10 ENCOUNTER — Encounter: Payer: Self-pay | Admitting: Emergency Medicine

## 2019-01-10 ENCOUNTER — Other Ambulatory Visit: Payer: Self-pay

## 2019-01-10 DIAGNOSIS — Z87440 Personal history of urinary (tract) infections: Secondary | ICD-10-CM

## 2019-01-10 DIAGNOSIS — N390 Urinary tract infection, site not specified: Secondary | ICD-10-CM | POA: Diagnosis not present

## 2019-01-10 DIAGNOSIS — B9689 Other specified bacterial agents as the cause of diseases classified elsewhere: Secondary | ICD-10-CM

## 2019-01-10 DIAGNOSIS — B373 Candidiasis of vulva and vagina: Secondary | ICD-10-CM | POA: Diagnosis not present

## 2019-01-10 DIAGNOSIS — R319 Hematuria, unspecified: Secondary | ICD-10-CM | POA: Diagnosis not present

## 2019-01-10 DIAGNOSIS — B3731 Acute candidiasis of vulva and vagina: Secondary | ICD-10-CM

## 2019-01-10 LAB — URINALYSIS, COMPLETE (UACMP) WITH MICROSCOPIC
Bilirubin Urine: NEGATIVE
Glucose, UA: NEGATIVE mg/dL
Ketones, ur: NEGATIVE mg/dL
Nitrite: NEGATIVE
Protein, ur: NEGATIVE mg/dL
Specific Gravity, Urine: 1.01 (ref 1.005–1.030)
WBC, UA: >50 WBC/hpf (ref 0–5)
pH: 5.5 (ref 5.0–8.0)

## 2019-01-10 LAB — WET PREP, GENITAL
Clue Cells Wet Prep HPF POC: NONE SEEN
Sperm: NONE SEEN
Trich, Wet Prep: NONE SEEN

## 2019-01-10 MED ORDER — CEPHALEXIN 500 MG PO CAPS: 500.0000 mg | ORAL_CAPSULE | Freq: Two times a day (BID) | ORAL | 0 refills | Status: AC

## 2019-01-10 MED ORDER — FLUCONAZOLE 150 MG PO TABS: 150.0000 mg | ORAL_TABLET | Freq: Every day | ORAL | 0 refills | Status: DC

## 2019-01-10 NOTE — Discharge Instructions (Signed)
Take medication as prescribed. Rest. Drink plenty of fluids.   Follow up with your primary care physician this week as needed. Follow up with urology as discussed.   Return to Urgent care for new or worsening concerns.

## 2019-01-10 NOTE — ED Provider Notes (Signed)
[facility] ____________________________________________  Time seen: Approximately 8:02 PM  I have reviewed the triage vital signs and the nursing notes.   HISTORY  Chief Complaint Dysuria and Urinary Frequency  HPI Neppie Sharya Headen is a 43 y.o. female presenting for evaluation of urinary frequency, suprapubic pressure and low back aching pain present for the last several days.  States that she has been having UTIs over the last few months with this possibly being the third.  Some burning with urination.  Occasional vaginal discharge, stating not atypical.  Denies vomiting, diarrhea, fevers.  Denies concerns of STDs, pregnancy, chest pain, shortness of breath or other complaints.  States symptoms do fully resolve in between treatment.  Seen in January treated with Keflex for UTI, beginning February treated with Macrobid.  States both cultures did not grow out confirming.  Also had yeast vaginitis.  States that she has a history of this happening a few years ago, stating that she had several UTIs back to back and that eventually fully resolved after seeing urology without intervention.  Denies aggravating alleviating factors.  Continues to eat and drink well.  Denies other complaints.  Reubin Milan, MD: PCP    Past Medical History:  Diagnosis Date  . Abnormal liver function   . Allergic rhinitis   . Anemia, iron deficiency   . Cholelithiasis   . Conjunctivitis   . Dysmenorrhea   . Ganglion cyst   . Hemorrhoids   . HTN (hypertension)   . Hyperlipidemia   . LIVER FUNCTION TESTS, ABNORMAL 08/16/2007   Resolved after cholecystectomy   . Miscarriage 2008  . Obesity     Patient Active Problem List   Diagnosis Date Noted  . Pre-diabetes 11/07/2018  . Mixed hyperlipidemia 11/05/2018  . Hematuria, undiagnosed cause 05/30/2016  . Environmental and seasonal allergies 02/08/2016  . Essential hypertension 02/08/2016  . Family history of colon cancer in mother  08/06/2015  . ALLERGIC RHINITIS 08/16/2007  . Dysmenorrhea 08/16/2007    Past Surgical History:  Procedure Laterality Date  . CHOLECYSTECTOMY  2007  . COLONOSCOPY  07/2015  . GANGLION CYST EXCISION     Cyst     No current facility-administered medications for this encounter.   Current Outpatient Medications:  .  amLODipine (NORVASC) 10 MG tablet, Take 1 tablet (10 mg total) by mouth daily., Disp: 90 tablet, Rfl: 1 .  loratadine (CLARITIN) 10 MG tablet, Take 10 mg by mouth daily., Disp: , Rfl:  .  cephALEXin (KEFLEX) 500 MG capsule, Take 1 capsule (500 mg total) by mouth 2 (two) times daily for 7 days., Disp: 14 capsule, Rfl: 0 .  fluconazole (DIFLUCAN) 150 MG tablet, Take 1 tablet (150 mg total) by mouth daily. Take one pill orally, then Repeat in one week, Disp: 2 tablet, Rfl: 0  Allergies Betadine [povidone iodine]; Ciprofloxacin; and Neomycin-bacitracin zn-polymyx  Family History  Problem Relation Age of Onset  . Colon cancer Mother        died at 45  . Diabetes Father   . Heart disease Father   . Aneurysm Father   . Colon polyps Maternal Grandmother        benign but still had colectomy  . Heart disease Paternal Grandfather   . Kidney disease Neg Hx   . Bladder Cancer Neg Hx   . Breast cancer Neg Hx     Social History Social History   Tobacco Use  . Smoking status: Former Smoker    Last attempt to quit:  11/17/2004    Years since quitting: 14.1  . Smokeless tobacco: Never Used  Substance Use Topics  . Alcohol use: No    Alcohol/week: 0.0 standard drinks  . Drug use: No    Review of Systems Constitutional: No fever Cardiovascular: Denies chest pain. Respiratory: Denies shortness of breath. Gastrointestinal: as above.  Genitourinary: positive for dysuria. Musculoskeletal: positive for back pain. Skin: Negative for rash.   ____________________________________________   PHYSICAL EXAM:  VITAL SIGNS: ED Triage Vitals  Enc Vitals Group     BP 01/10/19  1807 120/87     Pulse Rate 01/10/19 1807 81     Resp 01/10/19 1807 18     Temp 01/10/19 1807 99 F (37.2 C)     Temp Source 01/10/19 1807 Oral     SpO2 01/10/19 1807 100 %     Weight 01/10/19 1805 260 lb (117.9 kg)     Height 01/10/19 1805 5\' 8"  (1.727 m)     Head Circumference --      Peak Flow --      Pain Score 01/10/19 1805 4     Pain Loc --      Pain Edu? --      Excl. in GC? --     Constitutional: Alert and oriented. Well appearing and in no acute distress. ENT      Head: Normocephalic and atraumatic. Cardiovascular: Normal rate, regular rhythm. Grossly normal heart sounds.  Good peripheral circulation. Respiratory: Normal respiratory effort without tachypnea nor retractions. Breath sounds are clear and equal bilaterally. No wheezes, rales, rhonchi. Gastrointestinal: Mild suprapubic pressure. Abdomen otherwise soft and nontender. No CVA tenderness. Musculoskeletal:  No midline cervical, thoracic or lumbar tenderness to palpation. Neurologic:  Normal speech and language. Speech is normal. No gait instability.  Skin:  Skin is warm, dry and intact. No rash noted. Psychiatric: Mood and affect are normal. Speech and behavior are normal. Patient exhibits appropriate insight and judgment   ___________________________________________   LABS (all labs ordered are listed, but only abnormal results are displayed)  Labs Reviewed  WET PREP, GENITAL - Abnormal; Notable for the following components:      Result Value   Yeast Wet Prep HPF POC PRESENT (*)    WBC, Wet Prep HPF POC FEW (*)    All other components within normal limits  URINALYSIS, COMPLETE (UACMP) WITH MICROSCOPIC - Abnormal; Notable for the following components:   Color, Urine STRAW (*)    APPearance HAZY (*)    Hgb urine dipstick MODERATE (*)    Leukocytes,Ua LARGE (*)    Bacteria, UA RARE (*)    All other components within normal limits  URINE CULTURE     PROCEDURES Procedures   INITIAL IMPRESSION /  ASSESSMENT AND PLAN / ED COURSE  Pertinent labs & imaging results that were available during my care of the patient were reviewed by me and considered in my medical decision making (see chart for details).  No acute distress.  Dysuria, urinalysis reviewed, suspect UTI.  Discussed wet prep, patient elected for self wet prep.  Wet prep positive for yeast as well as urine was positive for yeast.  This being her third dysuria complaint in the last 2 months.  Patient treated in January with Keflex then Macrobid.  Patient allergic to Cipro and states does not tolerate Bactrim well.  We will culture urine and treat with Keflex.  Diflucan.  Follow-up with primary care as well as recommend follow-up with urology.  Supportive care. Discussed  indication, risks and benefits of medications with patient.  Discussed follow up with Primary care physician this week. Discussed follow up and return parameters including no resolution or any worsening concerns. Patient verbalized understanding and agreed to plan.   ____________________________________________   FINAL CLINICAL IMPRESSION(S) / ED DIAGNOSES  Final diagnoses:  Urinary tract infection with hematuria, site unspecified  Yeast vaginitis     ED Discharge Orders         Ordered    cephALEXin (KEFLEX) 500 MG capsule  2 times daily     01/10/19 2033    fluconazole (DIFLUCAN) 150 MG tablet  Daily     01/10/19 2033           Note: This dictation was prepared with Dragon dictation along with smaller phrase technology. Any transcriptional errors that result from this process are unintentional.         Renford DillsMiller, Anand Tejada, NP 01/10/19 2142

## 2019-01-10 NOTE — ED Triage Notes (Signed)
Patient c/o dysuria, low back pain and urinary frequency that started yesterday.

## 2019-01-13 ENCOUNTER — Telehealth (HOSPITAL_COMMUNITY): Payer: Self-pay | Admitting: Emergency Medicine

## 2019-01-13 LAB — URINE CULTURE: Culture: 30000 — AB

## 2019-01-13 NOTE — Telephone Encounter (Signed)
Urine culture was positive for e coli and was given keflex  at urgent care visit. Attempted to reach patient. No answer at this time.   

## 2019-02-22 ENCOUNTER — Other Ambulatory Visit: Payer: Self-pay

## 2019-02-22 ENCOUNTER — Encounter: Payer: Self-pay | Admitting: Internal Medicine

## 2019-02-22 ENCOUNTER — Ambulatory Visit: Payer: BLUE CROSS/BLUE SHIELD | Admitting: Internal Medicine

## 2019-02-22 VITALS — BP 112/76 | HR 96 | Temp 98.0°F | Ht 68.0 in | Wt 260.0 lb

## 2019-02-22 DIAGNOSIS — N3000 Acute cystitis without hematuria: Secondary | ICD-10-CM

## 2019-02-22 LAB — POC URINALYSIS WITH MICROSCOPIC (NON AUTO)MANUAL RESULT
Bilirubin, UA: NEGATIVE
Crystals: 0
Epithelial cells, urine per micros: 5
Glucose, UA: NEGATIVE
Ketones, UA: NEGATIVE
Mucus, UA: 0
Nitrite, UA: NEGATIVE
Protein, UA: NEGATIVE
RBC: 2 M/uL — AB (ref 4.04–5.48)
Spec Grav, UA: >=1.03 — AB (ref 1.010–1.025)
Urobilinogen, UA: 0.2 E.U./dL
WBC Casts, UA: 10
pH, UA: 5 (ref 5.0–8.0)

## 2019-02-22 MED ORDER — NITROFURANTOIN MONOHYD MACRO 100 MG PO CAPS: 100.0000 mg | ORAL_CAPSULE | Freq: Two times a day (BID) | ORAL | 0 refills | Status: AC

## 2019-02-22 NOTE — Progress Notes (Signed)
Date:  02/22/2019   Name:  Deanna Potts   DOB:  1976-04-07   MRN:  [id number]   Chief Complaint: Urinary Tract Infection (Started last night again. Cramps on right side. Go to the bathroom a lot. No pain or burning. )  Urinary Tract Infection   This is a recurrent problem. The current episode started in the past 7 days. The quality of the pain is described as burning. The pain is mild. There has been no fever. Associated symptoms include frequency and urgency. Pertinent negatives include no chills, hematuria or nausea. treated for E Coli UTI by UC in February  Also UC in January - culture mixed, treated both times with Keflex. Seen several years ago by Urology for hematuria and UTIs. - work up negative.  CT 2017: IMPRESSION: 1. Lower portion of the right ureter is poorly opacified, limiting evaluation. Otherwise, no findings to explain the patient's hematuria and recurrent UTIs. 2. Punctate left renal stone.  Review of Systems  Constitutional: Negative for chills, fatigue and fever.  Respiratory: Negative for shortness of breath and wheezing.   Cardiovascular: Negative for chest pain, palpitations and leg swelling.  Gastrointestinal: Positive for abdominal pain. Negative for diarrhea and nausea.  Genitourinary: Positive for dysuria, frequency and urgency. Negative for hematuria and pelvic pain.  Neurological: Negative for dizziness and headaches.    Patient Active Problem List   Diagnosis Date Noted  . Pre-diabetes 11/07/2018  . Mixed hyperlipidemia 11/05/2018  . Hematuria, undiagnosed cause 05/30/2016  . Environmental and seasonal allergies 02/08/2016  . Essential hypertension 02/08/2016  . Family history of colon cancer in mother 08/06/2015  . ALLERGIC RHINITIS 08/16/2007  . Dysmenorrhea 08/16/2007    Allergies  Allergen Reactions  . Betadine [Povidone Iodine]   . Ciprofloxacin Hives  . Neomycin-Bacitracin Zn-Polymyx     REACTION: ? reaction    Past Surgical  History:  Procedure Laterality Date  . CHOLECYSTECTOMY  2007  . COLONOSCOPY  07/2015  . GANGLION CYST EXCISION     Cyst    Social History   Tobacco Use  . Smoking status: Former Smoker    Packs/day: 1.00    Years: 12.00    Pack years: 12.00    Types: Cigarettes    Last attempt to quit: 11/17/2004    Years since quitting: 14.2  . Smokeless tobacco: Never Used  Substance Use Topics  . Alcohol use: No    Alcohol/week: 0.0 standard drinks  . Drug use: No     Medication list has been reviewed and updated.  Current Meds  Medication Sig  . amLODipine (NORVASC) 10 MG tablet Take 1 tablet (10 mg total) by mouth daily.  Marland Kitchen loratadine (CLARITIN) 10 MG tablet Take 10 mg by mouth daily.    PHQ 2/9 Scores 02/22/2019 12/23/2018 11/05/2018 05/30/2016  PHQ - 2 Score 0 0 0 0    BP Readings from Last 3 Encounters:  02/22/19 112/76  01/10/19 120/87  12/23/18 136/84    Physical Exam Vitals signs and nursing note reviewed.  Constitutional:      Appearance: She is well-developed.  Neck:     Musculoskeletal: Normal range of motion and neck supple.  Cardiovascular:     Rate and Rhythm: Normal rate and regular rhythm.     Heart sounds: Normal heart sounds. No murmur.  Pulmonary:     Effort: Pulmonary effort is normal. No respiratory distress.     Breath sounds: Normal breath sounds.  Abdominal:  General: Bowel sounds are normal.     Palpations: Abdomen is soft.     Tenderness: There is abdominal tenderness in the suprapubic area. There is no guarding or rebound.  Skin:    General: Skin is warm.     Wt Readings from Last 3 Encounters:  02/22/19 260 lb (117.9 kg)  01/10/19 260 lb (117.9 kg)  12/23/18 261 lb (118.4 kg)    BP 112/76   Pulse 96   Temp 98 F (36.7 C) (Oral)   Ht 5\' 8"  (1.727 m)   Wt 260 lb (117.9 kg)   SpO2 98%   BMI 39.53 kg/m   Assessment and Plan: 1. Acute cystitis without hematuria Unable to get culture today If sx recur, will refer to Urology -  POC urinalysis w microscopic (non auto) - nitrofurantoin, macrocrystal-monohydrate, (MACROBID) 100 MG capsule; Take 1 capsule (100 mg total) by mouth 2 (two) times daily for 7 days.  Dispense: 14 capsule; Refill: 0   Partially dictated using Animal nutritionistDragon software. Any errors are unintentional.  Bari EdwardLaura , MD [facility]  02/22/2019

## 2019-02-22 NOTE — Patient Instructions (Signed)
This information is directly available on the CDC website: https://www.cdc.gov/coronavirus/2019-ncov/if-you-are-sick/steps-when-sick.html    Source:CDC Reference to specific commercial products, manufacturers, companies, or trademarks does not constitute its endorsement or recommendation by the U.S. Government, Department of Health and Human Services, or Centers for Disease Control and Prevention.  

## 2019-04-15 ENCOUNTER — Encounter: Payer: Self-pay | Admitting: Internal Medicine

## 2019-04-15 ENCOUNTER — Ambulatory Visit: Payer: BLUE CROSS/BLUE SHIELD | Admitting: Internal Medicine

## 2019-04-15 ENCOUNTER — Other Ambulatory Visit: Payer: Self-pay

## 2019-04-15 DIAGNOSIS — N3001 Acute cystitis with hematuria: Secondary | ICD-10-CM

## 2019-04-15 DIAGNOSIS — I10 Essential (primary) hypertension: Secondary | ICD-10-CM | POA: Diagnosis not present

## 2019-04-15 DIAGNOSIS — J0100 Acute maxillary sinusitis, unspecified: Secondary | ICD-10-CM | POA: Diagnosis not present

## 2019-04-15 LAB — POCT URINALYSIS DIPSTICK
Bilirubin, UA: NEGATIVE
Glucose, UA: NEGATIVE
Ketones, UA: NEGATIVE
Nitrite, UA: NEGATIVE
Protein, UA: NEGATIVE
Spec Grav, UA: 1.01 (ref 1.010–1.025)
Urobilinogen, UA: 0.2 E.U./dL
pH, UA: 6 (ref 5.0–8.0)

## 2019-04-15 MED ORDER — CEFUROXIME AXETIL 500 MG PO TABS: 500.0000 mg | ORAL_TABLET | Freq: Two times a day (BID) | ORAL | 0 refills | Status: DC

## 2019-04-15 MED ORDER — LISINOPRIL 20 MG PO TABS: 20.0000 mg | ORAL_TABLET | Freq: Every day | ORAL | 1 refills | Status: DC

## 2019-04-15 NOTE — Progress Notes (Signed)
Date:  04/15/2019   Name:  Deanna Potts   DOB:  Sep 15, 1976   MRN:  [id number]   Chief Complaint: Urinary Tract Infection (Thirsty a lot. Urgency to urinate.) and Sinusitis (Ear and sinus pain and pressure. Felt lightheaded slightly yesterday. )  Urinary Tract Infection   This is a new problem. The problem has been gradually worsening. The patient is experiencing no pain. Associated symptoms include frequency and urgency. Pertinent negatives include no chills. Her past medical history is significant for recurrent UTIs.  Sinusitis  This is a new problem. The current episode started in the past 7 days. There has been no fever. Associated symptoms include congestion, ear pain, sinus pressure and a sore throat. Pertinent negatives include no chills, coughing, headaches or shortness of breath. Treatments tried: claritin.  Hypertension  This is a chronic problem. The problem is controlled. Associated symptoms include peripheral edema. Pertinent negatives include no chest pain, headaches, palpitations or shortness of breath. Past treatments include calcium channel blockers. Compliance problems include medication side effects (edema since being on amlodipine).     Review of Systems  Constitutional: Negative for chills, fatigue and fever.  HENT: Positive for congestion, ear pain, sinus pressure and sore throat.   Respiratory: Negative for cough, chest tightness, shortness of breath and wheezing.   Cardiovascular: Positive for leg swelling. Negative for chest pain and palpitations.  Gastrointestinal: Negative for abdominal pain, blood in stool, constipation and diarrhea.  Genitourinary: Positive for frequency and urgency.  Allergic/Immunologic: Negative for environmental allergies.  Neurological: Positive for light-headedness. Negative for dizziness and headaches.  Hematological: Negative for adenopathy.    Patient Active Problem List   Diagnosis Date Noted  . Pre-diabetes 11/07/2018  .  Mixed hyperlipidemia 11/05/2018  . Hematuria, undiagnosed cause 05/30/2016  . Environmental and seasonal allergies 02/08/2016  . Essential hypertension 02/08/2016  . Family history of colon cancer in mother 08/06/2015  . ALLERGIC RHINITIS 08/16/2007  . Dysmenorrhea 08/16/2007    Allergies  Allergen Reactions  . Betadine [Povidone Iodine]   . Ciprofloxacin Hives  . Neomycin-Bacitracin Zn-Polymyx     REACTION: ? reaction    Past Surgical History:  Procedure Laterality Date  . CHOLECYSTECTOMY  2007  . COLONOSCOPY  07/2015  . GANGLION CYST EXCISION     Cyst    Social History   Tobacco Use  . Smoking status: Former Smoker    Packs/day: 1.00    Years: 12.00    Pack years: 12.00    Types: Cigarettes    Last attempt to quit: 11/17/2004    Years since quitting: 14.4  . Smokeless tobacco: Never Used  Substance Use Topics  . Alcohol use: No    Alcohol/week: 0.0 standard drinks  . Drug use: No     Medication list has been reviewed and updated.  Current Meds  Medication Sig  . amLODipine (NORVASC) 10 MG tablet Take 1 tablet (10 mg total) by mouth daily.  Marland Kitchen. loratadine (CLARITIN) 10 MG tablet Take 10 mg by mouth daily.    PHQ 2/9 Scores 04/15/2019 02/22/2019 12/23/2018 11/05/2018  PHQ - 2 Score 0 0 0 0    BP Readings from Last 3 Encounters:  04/15/19 126/80  02/22/19 112/76  01/10/19 120/87    Physical Exam Vitals signs and nursing note reviewed.  Constitutional:      Appearance: She is well-developed.  HENT:     Right Ear: Tympanic membrane and ear canal normal.     Left Ear: Tympanic membrane  and ear canal normal.     Nose:     Right Sinus: Maxillary sinus tenderness present.     Left Sinus: Maxillary sinus tenderness present.     Mouth/Throat:     Mouth: Mucous membranes are moist.  Cardiovascular:     Rate and Rhythm: Normal rate and regular rhythm.     Pulses: Normal pulses.     Heart sounds: Normal heart sounds.  Pulmonary:     Effort: Pulmonary effort is  normal. No respiratory distress.     Breath sounds: Normal breath sounds.  Abdominal:     General: Bowel sounds are normal.     Palpations: Abdomen is soft.     Tenderness: There is abdominal tenderness in the suprapubic area. There is no guarding or rebound.     Wt Readings from Last 3 Encounters:  04/15/19 266 lb 9.6 oz (120.9 kg)  02/22/19 260 lb (117.9 kg)  01/10/19 260 lb (117.9 kg)    BP 126/80   Pulse (!) 102   Temp 98.9 F (37.2 C) (Oral)   Ht 5\' 8"  (1.727 m)   Wt 266 lb 9.6 oz (120.9 kg)   SpO2 97%   BMI 40.54 kg/m   Assessment and Plan: 1. Acute cystitis with hematuria Continue to increase fluids - cefUROXime (CEFTIN) 500 MG tablet; Take 1 tablet (500 mg total) by mouth 2 (two) times daily with a meal for 10 days.  Dispense: 20 tablet; Refill: 0  2. Acute non-recurrent maxillary sinusitis - cefUROXime (CEFTIN) 500 MG tablet; Take 1 tablet (500 mg total) by mouth 2 (two) times daily with a meal for 10 days.  Dispense: 20 tablet; Refill: 0  3. Essential hypertension Once antibiotics are complete, stop amlodipine and start lisinopril Monitor BP for worsening; otherwise follow up as planned - lisinopril (ZESTRIL) 20 MG tablet; Take 1 tablet (20 mg total) by mouth daily.  Dispense: 90 tablet; Refill: 1   Partially dictated using Animal nutritionist. Any errors are unintentional.  Bari Edward, MD [facility]  04/15/2019

## 2019-04-17 ENCOUNTER — Encounter: Payer: Self-pay | Admitting: Internal Medicine

## 2019-04-18 ENCOUNTER — Encounter: Payer: Self-pay | Admitting: Internal Medicine

## 2019-04-18 ENCOUNTER — Other Ambulatory Visit: Payer: Self-pay | Admitting: Internal Medicine

## 2019-04-18 ENCOUNTER — Telehealth: Payer: Self-pay | Admitting: Internal Medicine

## 2019-04-18 DIAGNOSIS — J0100 Acute maxillary sinusitis, unspecified: Secondary | ICD-10-CM

## 2019-04-18 DIAGNOSIS — N3001 Acute cystitis with hematuria: Secondary | ICD-10-CM

## 2019-04-18 MED ORDER — SULFAMETHOXAZOLE-TRIMETHOPRIM 800-160 MG PO TABS: 1.0000 | ORAL_TABLET | Freq: Two times a day (BID) | ORAL | 0 refills | Status: AC

## 2019-04-18 NOTE — Telephone Encounter (Signed)
Rx sent for Bactrim DS

## 2019-04-18 NOTE — Telephone Encounter (Signed)
Patient came in for UTI this past friday and is having nausea,diarrhea and gas.

## 2019-04-18 NOTE — Telephone Encounter (Signed)
Patient informed on my chart.

## 2019-04-18 NOTE — Telephone Encounter (Signed)
Spoke with patient. She officially missed her first dose of abx for UTI today. She said she is having extreme diarrhea. Needs diff med.   Please advise.

## 2019-04-18 NOTE — Telephone Encounter (Signed)
Patient complaining of side effects to medications. Please advise.

## 2019-06-01 ENCOUNTER — Other Ambulatory Visit: Payer: Self-pay | Admitting: Internal Medicine

## 2019-09-29 ENCOUNTER — Ambulatory Visit: Payer: BC Managed Care – PPO | Admitting: Internal Medicine

## 2019-09-29 ENCOUNTER — Other Ambulatory Visit: Payer: Self-pay

## 2019-09-29 ENCOUNTER — Encounter: Payer: Self-pay | Admitting: Internal Medicine

## 2019-09-29 VITALS — BP 132/85 | HR 77 | Ht 68.0 in | Wt 236.0 lb

## 2019-09-29 DIAGNOSIS — B3749 Other urogenital candidiasis: Secondary | ICD-10-CM | POA: Diagnosis not present

## 2019-09-29 DIAGNOSIS — I10 Essential (primary) hypertension: Secondary | ICD-10-CM

## 2019-09-29 LAB — POC URINALYSIS WITH MICROSCOPIC (NON AUTO)MANUAL RESULT
Bacteria, UA: 0
Bilirubin, UA: NEGATIVE
Crystals: 0
Epithelial cells, urine per micros: 0
Glucose, UA: NEGATIVE
Leukocytes, UA: NEGATIVE
Mucus, UA: 0
Nitrite, UA: NEGATIVE
Protein, UA: NEGATIVE
RBC: 0 M/uL — AB (ref 4.04–5.48)
Spec Grav, UA: 1.01
Urobilinogen, UA: 0.2 U/dL
WBC Casts, UA: 0
pH, UA: 5

## 2019-09-29 MED ORDER — FLUCONAZOLE 100 MG PO TABS: 100.0000 mg | ORAL_TABLET | Freq: Every day | ORAL | 0 refills | Status: DC

## 2019-09-29 NOTE — Progress Notes (Signed)
Date:  09/29/2019   Name:  Deanna Potts   DOB:  07-05-1976   MRN:  [id number]   Chief Complaint: Urinary Tract Infection (Lower back pain. Off and off for 1 week. Frequency but drinks a lot of fluids. Back pain woke her up this morning after rolling over. Bladder feels uncomfortable. )  Urinary Tract Infection  This is a new problem. The current episode started in the past 7 days. The problem has been unchanged. The pain is mild (in the mid back). There has been no fever. Associated symptoms include flank pain. Pertinent negatives include no chills, discharge, frequency, hematuria, hesitancy, urgency or vomiting. She has tried increased fluids for the symptoms.    Lab Results  Component Value Date   CREATININE 0.76 11/05/2018   BUN 8 11/05/2018   NA 141 11/05/2018   K 4.1 11/05/2018   CL 101 11/05/2018   CO2 22 11/05/2018   Lab Results  Component Value Date   CHOL 206 (H) 11/05/2018   HDL 47 11/05/2018   LDLCALC 138 (H) 11/05/2018   LDLDIRECT 134.7 08/26/2007   TRIG 106 11/05/2018   CHOLHDL 4.4 11/05/2018   Lab Results  Component Value Date   TSH 1.000 11/05/2018   Lab Results  Component Value Date   HGBA1C 5.9 (H) 11/05/2018     Review of Systems  Constitutional: Positive for unexpected weight change (losing weight on Keto diet). Negative for chills, fatigue and fever.  Respiratory: Negative for cough, chest tightness and shortness of breath.   Cardiovascular: Negative for chest pain, palpitations and leg swelling.  Gastrointestinal: Negative for vomiting.  Genitourinary: Positive for flank pain. Negative for frequency, hematuria, hesitancy, urgency, vaginal discharge and vaginal pain.  Neurological: Negative for dizziness and headaches.    Patient Active Problem List   Diagnosis Date Noted  . Pre-diabetes 11/07/2018  . Mixed hyperlipidemia 11/05/2018  . Hematuria, undiagnosed cause 05/30/2016  . Environmental and seasonal allergies 02/08/2016  .  Essential hypertension 02/08/2016  . Family history of colon cancer in mother 08/06/2015  . ALLERGIC RHINITIS 08/16/2007  . Dysmenorrhea 08/16/2007    Allergies  Allergen Reactions  . Ceftin [Cefuroxime] Diarrhea  . Betadine [Povidone Iodine]   . Ciprofloxacin Hives  . Neomycin-Bacitracin Zn-Polymyx     REACTION: ? reaction    Past Surgical History:  Procedure Laterality Date  . CHOLECYSTECTOMY  2007  . COLONOSCOPY  07/2015  . GANGLION CYST EXCISION     Cyst    Social History   Tobacco Use  . Smoking status: Former Smoker    Packs/day: 1.00    Years: 12.00    Pack years: 12.00    Types: Cigarettes    Quit date: 11/17/2004    Years since quitting: 14.8  . Smokeless tobacco: Never Used  Substance Use Topics  . Alcohol use: No    Alcohol/week: 0.0 standard drinks  . Drug use: No     Medication list has been reviewed and updated.  Current Meds  Medication Sig  . amLODipine (NORVASC) 10 MG tablet TAKE 1 TABLET BY MOUTH DAILY  . lisinopril (ZESTRIL) 20 MG tablet Take 1 tablet (20 mg total) by mouth daily.  Marland Kitchen loratadine (CLARITIN) 10 MG tablet Take 10 mg by mouth daily.    PHQ 2/9 Scores 09/29/2019 04/15/2019 02/22/2019 12/23/2018  PHQ - 2 Score 0 0 0 0    BP Readings from Last 3 Encounters:  09/29/19 132/85  04/15/19 126/80  02/22/19 112/76  Physical Exam Vitals signs and nursing note reviewed.  Constitutional:      General: She is not in acute distress.    Appearance: Normal appearance. She is well-developed.  HENT:     Head: Normocephalic and atraumatic.  Cardiovascular:     Rate and Rhythm: Normal rate and regular rhythm.  Pulmonary:     Effort: Pulmonary effort is normal. No respiratory distress.     Breath sounds: No wheezing or rhonchi.  Abdominal:     General: There is no distension.     Palpations: Abdomen is soft. There is no mass.     Tenderness: There is no abdominal tenderness. There is no right CVA tenderness, left CVA tenderness, guarding  or rebound.  Musculoskeletal:     Right lower leg: No edema.     Left lower leg: No edema.  Skin:    General: Skin is warm and dry.     Findings: No rash.  Neurological:     Mental Status: She is alert and oriented to person, place, and time.  Psychiatric:        Behavior: Behavior normal.        Thought Content: Thought content normal.     Wt Readings from Last 3 Encounters:  09/29/19 236 lb (107 kg)  04/15/19 266 lb 9.6 oz (120.9 kg)  02/22/19 260 lb (117.9 kg)    BP 132/85   Pulse 77   Ht 5\' 8"  (1.727 m)   Wt 236 lb (107 kg)   SpO2 96%   BMI 35.88 kg/m   Assessment and Plan: 1. Yeast UTI UA shows only yeast - will treat with one week of diflucan - fluconazole (DIFLUCAN) 100 MG tablet; Take 1 tablet (100 mg total) by mouth daily for 7 days.  Dispense: 7 tablet; Refill: 0  2. Essential hypertension Controlled    Partially dictated using . Any errors are unintentional.  Animal nutritionist, MD [facility]  09/29/2019

## 2019-10-26 DIAGNOSIS — Z6835 Body mass index (BMI) 35.0-35.9, adult: Secondary | ICD-10-CM | POA: Diagnosis not present

## 2019-10-26 DIAGNOSIS — Z01419 Encounter for gynecological examination (general) (routine) without abnormal findings: Secondary | ICD-10-CM | POA: Diagnosis not present

## 2019-10-26 DIAGNOSIS — Z1231 Encounter for screening mammogram for malignant neoplasm of breast: Secondary | ICD-10-CM | POA: Diagnosis not present

## 2019-11-10 ENCOUNTER — Encounter: Payer: BLUE CROSS/BLUE SHIELD | Admitting: Internal Medicine

## 2019-11-11 ENCOUNTER — Other Ambulatory Visit: Payer: Self-pay | Admitting: Internal Medicine

## 2019-11-16 ENCOUNTER — Ambulatory Visit (INDEPENDENT_AMBULATORY_CARE_PROVIDER_SITE_OTHER): Payer: BC Managed Care – PPO | Admitting: Internal Medicine

## 2019-11-16 ENCOUNTER — Encounter: Payer: Self-pay | Admitting: Internal Medicine

## 2019-11-16 ENCOUNTER — Other Ambulatory Visit: Payer: Self-pay

## 2019-11-16 VITALS — BP 108/77 | HR 80 | Resp 16 | Ht 68.0 in | Wt 222.0 lb

## 2019-11-16 DIAGNOSIS — I10 Essential (primary) hypertension: Secondary | ICD-10-CM

## 2019-11-16 DIAGNOSIS — E782 Mixed hyperlipidemia: Secondary | ICD-10-CM

## 2019-11-16 DIAGNOSIS — Z0000 Encounter for general adult medical examination without abnormal findings: Secondary | ICD-10-CM

## 2019-11-16 DIAGNOSIS — Z1231 Encounter for screening mammogram for malignant neoplasm of breast: Secondary | ICD-10-CM | POA: Diagnosis not present

## 2019-11-16 DIAGNOSIS — R7303 Prediabetes: Secondary | ICD-10-CM

## 2019-11-16 LAB — POCT URINALYSIS DIPSTICK
Bilirubin, UA: NEGATIVE
Glucose, UA: NEGATIVE
Ketones, UA: NEGATIVE
Nitrite, UA: NEGATIVE
Protein, UA: NEGATIVE
Spec Grav, UA: 1.015 (ref 1.010–1.025)
Urobilinogen, UA: 0.2 E.U./dL
pH, UA: 6 (ref 5.0–8.0)

## 2019-11-16 NOTE — Progress Notes (Signed)
Date:  11/16/2019   Name:  Deanna AlbertsChristy Lynn Potts   DOB:  03/03/1976   MRN:  [id number]   Chief Complaint: Annual Exam Deanna Potts is a 43 y.o. female who presents today for her Complete Annual Exam. She feels well. She reports exercising some walking. She reports she is sleeping fairly well. Mammograms ordered by GYN.  She continues to lose weight on the Keto diet.  She feels well other than some occasional toe cramps.    [facility]oscopy 2016 - repeat 2021 Pap  09/2018 neg with cotesting Immunization History  Administered Date(s) Administered  . Influenza Split 10/22/2011  . Influenza Whole 08/26/2010  . Influenza, Quadrivalent, Recombinant, Inj, Pf 09/14/2019  . Influenza,inj,Quad PF,6+ Mos 08/22/2015  . Influenza-Unspecified 08/04/2016, 08/21/2017, 08/20/2018  . Td 06/05/2003  . Tdap 02/08/2016    Hypertension This is a chronic problem. The problem is controlled (bp at home 130/85). Pertinent negatives include no chest pain, headaches, palpitations or shortness of breath. Past treatments include calcium channel blockers (did not start lisinopril). There are no compliance problems.   Diabetes She presents for her follow-up diabetic visit. Diabetes type: pre-diabetes. Pertinent negatives for hypoglycemia include no dizziness, headaches, nervousness/anxiousness or tremors. Pertinent negatives for diabetes include no chest pain, no fatigue, no polydipsia and no polyuria.  Hyperlipidemia This is a chronic problem. Pertinent negatives include no chest pain or shortness of breath. Current antihyperlipidemic treatment includes diet change. There are no compliance problems.     Lab Results  Component Value Date   CREATININE 0.76 11/05/2018   BUN 8 11/05/2018   NA 141 11/05/2018   K 4.1 11/05/2018   CL 101 11/05/2018   CO2 22 11/05/2018   Lab Results  Component Value Date   CHOL 206 (H) 11/05/2018   HDL 47 11/05/2018   LDLCALC 138  (H) 11/05/2018   LDLDIRECT 134.7 08/26/2007   TRIG 106 11/05/2018   CHOLHDL 4.4 11/05/2018   Lab Results  Component Value Date   TSH 1.000 11/05/2018   Lab Results  Component Value Date   HGBA1C 5.9 (H) 11/05/2018     Review of Systems  Constitutional: Negative for chills, fatigue and fever.  HENT: Negative for congestion, hearing loss, tinnitus, trouble swallowing and voice change.   Eyes: Negative for visual disturbance.  Respiratory: Negative for cough, chest tightness, shortness of breath and wheezing.   Cardiovascular: Negative for chest pain, palpitations and leg swelling.  Gastrointestinal: Negative for abdominal pain, constipation, diarrhea and vomiting.       Gerd resolved since keto diet changes  Endocrine: Negative for polydipsia and polyuria.  Genitourinary: Negative for dysuria, frequency, genital sores, menstrual problem, vaginal bleeding and vaginal discharge.  Musculoskeletal: Negative for arthralgias, gait problem and joint swelling.  Skin: Negative for color change and rash.  Neurological: Negative for dizziness, tremors, light-headedness and headaches.  Hematological: Negative for adenopathy. Does not bruise/bleed easily.  Psychiatric/Behavioral: Negative for dysphoric mood and sleep disturbance. The patient is not nervous/anxious.     Patient Active Problem List   Diagnosis Date Noted  . Pre-diabetes 11/07/2018  . Mixed hyperlipidemia 11/05/2018  . Hematuria, undiagnosed cause 05/30/2016  . Environmental and seasonal allergies 02/08/2016  . Essential hypertension 02/08/2016  . Family history of colon cancer in mother 08/06/2015  . ALLERGIC RHINITIS 08/16/2007  . Dysmenorrhea 08/16/2007    Allergies  Allergen Reactions  . Ceftin [Cefuroxime] Diarrhea  . Betadine [Povidone Iodine]   . Ciprofloxacin Hives  .  Neomycin-Bacitracin Zn-Polymyx     REACTION: ? reaction    Past Surgical History:  Procedure Laterality Date  . CHOLECYSTECTOMY  2007  .  COLONOSCOPY  07/2015  . GANGLION CYST EXCISION     Cyst    Social History   Tobacco Use  . Smoking status: Former Smoker    Packs/day: 1.00    Years: 12.00    Pack years: 12.00    Types: Cigarettes    Quit date: 11/17/2004    Years since quitting: 15.0  . Smokeless tobacco: Never Used  Substance Use Topics  . Alcohol use: No    Alcohol/week: 0.0 standard drinks  . Drug use: No     Medication list has been reviewed and updated.  Current Meds  Medication Sig  . amLODipine (NORVASC) 10 MG tablet TAKE 1 TABLET BY MOUTH DAILY  . lisinopril (ZESTRIL) 20 MG tablet Take 1 tablet (20 mg total) by mouth daily.  Marland Kitchen loratadine (CLARITIN) 10 MG tablet Take 10 mg by mouth daily.    PHQ 2/9 Scores 11/16/2019 09/29/2019 04/15/2019 02/22/2019  PHQ - 2 Score 0 0 0 0    BP Readings from Last 3 Encounters:  11/16/19 108/77  09/29/19 132/85  04/15/19 126/80    Physical Exam Vitals and nursing note reviewed.  Constitutional:      General: She is not in acute distress.    Appearance: She is well-developed.  HENT:     Head: Normocephalic and atraumatic.     Right Ear: Tympanic membrane and ear canal normal.     Left Ear: Tympanic membrane and ear canal normal.     Nose:     Right Sinus: No maxillary sinus tenderness.     Left Sinus: No maxillary sinus tenderness.  Eyes:     General: No scleral icterus.       Right eye: No discharge.        Left eye: No discharge.     Conjunctiva/sclera: Conjunctivae normal.  Neck:     Thyroid: No thyromegaly.     Vascular: No carotid bruit.  Cardiovascular:     Rate and Rhythm: Normal rate and regular rhythm.     Pulses: Normal pulses.     Heart sounds: Normal heart sounds.  Pulmonary:     Effort: Pulmonary effort is normal. No respiratory distress.     Breath sounds: No wheezing.  Chest:     Breasts:        Right: No mass, nipple discharge, skin change or tenderness.        Left: No mass, nipple discharge, skin change or tenderness.    Abdominal:     General: Bowel sounds are normal.     Palpations: Abdomen is soft.     Tenderness: There is no abdominal tenderness.  Musculoskeletal:     Cervical back: Normal range of motion. No erythema.     Right lower leg: No edema.     Left lower leg: No edema.  Lymphadenopathy:     Cervical: No cervical adenopathy.  Skin:    General: Skin is warm and dry.     Capillary Refill: Capillary refill takes less than 2 seconds.     Findings: No rash.  Neurological:     General: No focal deficit present.     Mental Status: She is alert and oriented to person, place, and time.     Cranial Nerves: No cranial nerve deficit.     Sensory: No sensory deficit.  Deep Tendon Reflexes: Reflexes are normal and symmetric.  Psychiatric:        Speech: Speech normal.        Behavior: Behavior normal.        Thought Content: Thought content normal.     Wt Readings from Last 3 Encounters:  11/16/19 222 lb (100.7 kg)  09/29/19 236 lb (107 kg)  04/15/19 266 lb 9.6 oz (120.9 kg)    BP 108/77   Pulse 80   Resp 16   Ht 5\' 8"  (1.727 m)   Wt 222 lb (100.7 kg)   LMP 11/09/2019 (Exact Date)   SpO2 100%   BMI 33.75 kg/m   Assessment and Plan: 1. Annual physical exam Normal exam -continue weight loss efforts More regular exercise recommended - POCT urinalysis dipstick - Vitamin D (25 hydroxy)  2. Encounter for screening mammogram for breast cancer Recently done at [facility]  3. Essential hypertension Clinically stable exam with well controlled BP.   Tolerating medications, amlodipine, without side effects at this time. Pt to continue current regimen and low sodium diet; benefits of regular exercise as able discussed. - CBC with Differential/Platelet - Comprehensive metabolic panel - TSH  4. Pre-diabetes Should be much improved with diet and weight loss - Hemoglobin A1c  5. Mixed hyperlipidemia Not currently on medications - lipid panel with average CAD  risk Recommend continued weight loss; will see how Keto diet has affected her lipid parameters - Lipid panel   Partially dictated using Dragon software. Any errors are unintentional.  [facility], MD [facility]  11/16/2019

## 2019-11-17 LAB — CBC WITH DIFFERENTIAL/PLATELET
Basophils Absolute: 0 10*3/uL (ref 0.0–0.2)
Basos: 1 %
EOS (ABSOLUTE): 0.3 10*3/uL (ref 0.0–0.4)
Eos: 5 %
Hematocrit: 42.5 % (ref 34.0–46.6)
Hemoglobin: 13.9 g/dL (ref 11.1–15.9)
Immature Grans (Abs): 0 10*3/uL (ref 0.0–0.1)
Immature Granulocytes: 0 %
Lymphocytes Absolute: 1.8 10*3/uL (ref 0.7–3.1)
Lymphs: 27 %
MCH: 26.6 pg (ref 26.6–33.0)
MCHC: 32.7 g/dL (ref 31.5–35.7)
MCV: 81 fL (ref 79–97)
Monocytes Absolute: 0.4 10*3/uL (ref 0.1–0.9)
Monocytes: 7 %
Neutrophils Absolute: 4 10*3/uL (ref 1.4–7.0)
Neutrophils: 60 %
Platelets: 363 10*3/uL (ref 150–450)
RBC: 5.22 x10E6/uL (ref 3.77–5.28)
RDW: 13.6 % (ref 11.7–15.4)
WBC: 6.5 10*3/uL (ref 3.4–10.8)

## 2019-11-17 LAB — COMPREHENSIVE METABOLIC PANEL
ALT: 34 IU/L — ABNORMAL HIGH (ref 0–32)
AST: 19 IU/L (ref 0–40)
Albumin/Globulin Ratio: 1.5 (ref 1.2–2.2)
Albumin: 4.6 g/dL (ref 3.8–4.8)
Alkaline Phosphatase: 92 IU/L (ref 39–117)
BUN/Creatinine Ratio: 12 (ref 9–23)
BUN: 9 mg/dL (ref 6–24)
Bilirubin Total: 0.5 mg/dL (ref 0.0–1.2)
CO2: 25 mmol/L (ref 20–29)
Calcium: 10.8 mg/dL — ABNORMAL HIGH (ref 8.7–10.2)
Chloride: 101 mmol/L (ref 96–106)
Creatinine, Ser: 0.77 mg/dL (ref 0.57–1.00)
GFR calc Af Amer: 109 mL/min/{1.73_m2} (ref >59)
GFR calc non Af Amer: 94 mL/min/{1.73_m2} (ref >59)
Globulin, Total: 3.1 g/dL (ref 1.5–4.5)
Glucose: 107 mg/dL — ABNORMAL HIGH (ref 65–99)
Potassium: 4.6 mmol/L (ref 3.5–5.2)
Sodium: 141 mmol/L (ref 134–144)
Total Protein: 7.7 g/dL (ref 6.0–8.5)

## 2019-11-17 LAB — LIPID PANEL
Chol/HDL Ratio: 5 ratio — ABNORMAL HIGH (ref 0.0–4.4)
Cholesterol, Total: 207 mg/dL — ABNORMAL HIGH (ref 100–199)
HDL: 41 mg/dL (ref >39)
LDL Chol Calc (NIH): 141 mg/dL — ABNORMAL HIGH (ref 0–99)
Triglycerides: 140 mg/dL (ref 0–149)
VLDL Cholesterol Cal: 25 mg/dL (ref 5–40)

## 2019-11-17 LAB — TSH: TSH: 0.92 u[IU]/mL (ref 0.450–4.500)

## 2019-11-17 LAB — HEMOGLOBIN A1C
Est. average glucose Bld gHb Est-mCnc: 117 mg/dL
Hgb A1c MFr Bld: 5.7 % — ABNORMAL HIGH (ref 4.8–5.6)

## 2019-11-17 LAB — VITAMIN D 25 HYDROXY (VIT D DEFICIENCY, FRACTURES): Vit D, 25-Hydroxy: 26.4 ng/mL — ABNORMAL LOW (ref 30.0–100.0)

## 2020-04-26 ENCOUNTER — Other Ambulatory Visit: Payer: Self-pay | Admitting: Internal Medicine

## 2020-04-26 MED ORDER — AMLODIPINE BESYLATE 10 MG PO TABS: 10.0000 mg | ORAL_TABLET | Freq: Every day | ORAL | 1 refills | Status: DC

## 2020-04-26 NOTE — Telephone Encounter (Signed)
Pt has a new pharm [facility] and needs 90 day supply amlodipine 10 mg w/refill

## 2020-05-22 ENCOUNTER — Ambulatory Visit: Payer: BC Managed Care – PPO | Admitting: Internal Medicine

## 2020-07-25 ENCOUNTER — Encounter: Payer: Self-pay | Admitting: Internal Medicine

## 2020-07-26 ENCOUNTER — Other Ambulatory Visit: Payer: Self-pay

## 2020-07-26 DIAGNOSIS — Z1211 Encounter for screening for malignant neoplasm of colon: Secondary | ICD-10-CM

## 2020-07-28 ENCOUNTER — Encounter: Payer: Self-pay | Admitting: Internal Medicine

## 2020-08-02 ENCOUNTER — Telehealth (INDEPENDENT_AMBULATORY_CARE_PROVIDER_SITE_OTHER): Payer: Self-pay | Admitting: Gastroenterology

## 2020-08-02 ENCOUNTER — Other Ambulatory Visit: Payer: Self-pay

## 2020-08-02 DIAGNOSIS — Z800 Family history of malignant neoplasm of digestive organs: Secondary | ICD-10-CM

## 2020-08-02 DIAGNOSIS — Z1211 Encounter for screening for malignant neoplasm of colon: Secondary | ICD-10-CM

## 2020-08-02 MED ORDER — PEG 3350-KCL-NA BICARB-NACL 420 G PO SOLR: 4000.0000 mL | Freq: Once | ORAL | 0 refills | Status: AC

## 2020-08-02 NOTE — Progress Notes (Signed)
Gastroenterology Pre-Procedure Review  Request Date: Monday 08/20/20 Requesting Physician: Dr. Servando Snare  PATIENT REVIEW QUESTIONS: The patient responded to the following health history questions as indicated:    1. Are you having any GI issues? no 2. Do you have a personal history of Polyps? no 3. Do you have a family history of Colon Cancer or Polyps? yes (mother and maternal grandmother had colon cancer) 4. Diabetes Mellitus? no 5. Joint replacements in the past 12 months?no 6. Major health problems in the past 3 months?no 7. Any artificial heart valves, MVP, or defibrillator?no    MEDICATIONS & ALLERGIES:    Patient reports the following regarding taking any anticoagulation/antiplatelet therapy:   Plavix, Coumadin, Eliquis, Xarelto, Lovenox, Pradaxa, Brilinta, or Effient? no Aspirin? no  Patient confirms/reports the following medications:  Current Outpatient Medications  Medication Sig Dispense Refill  . amLODipine (NORVASC) 10 MG tablet Take 1 tablet (10 mg total) by mouth daily. 90 tablet 1  . loratadine (CLARITIN) 10 MG tablet Take 10 mg by mouth daily.     No current facility-administered medications for this visit.    Patient confirms/reports the following allergies:  Allergies  Allergen Reactions  . Ceftin [Cefuroxime] Diarrhea  . Betadine [Povidone Iodine]   . Ciprofloxacin Hives  . Neomycin-Bacitracin Zn-Polymyx     REACTION: ? reaction    No orders of the defined types were placed in this encounter.   AUTHORIZATION INFORMATION Primary Insurance: 1D#: Group #:  Secondary Insurance: 1D#: Group #:  SCHEDULE INFORMATION: Date: Monday 08/20/20 Time: Location: [facility]

## 2020-08-03 ENCOUNTER — Telehealth: Payer: Self-pay

## 2020-08-09 ENCOUNTER — Telehealth: Payer: Self-pay

## 2020-08-09 NOTE — Telephone Encounter (Signed)
Returned patients call. Patient wanted to know if she could have a soda the day before her procedure. Explained to her she can have light soda. Pt verbalized understanding.

## 2020-08-13 ENCOUNTER — Other Ambulatory Visit: Payer: Self-pay

## 2020-08-13 ENCOUNTER — Telehealth: Payer: Self-pay

## 2020-08-13 ENCOUNTER — Encounter: Payer: Self-pay | Admitting: Gastroenterology

## 2020-08-13 NOTE — Telephone Encounter (Signed)
Returned patients call to let her know she can go for COVID testing on Friday 08/17/20.  She said that its okay that she will go on Thursday 08/16/20.  LVM with Morrie Sheldon in Pre-Admit that patient decided that she will go on Thursday 08/16/20.  Thanks,  [place], [place]

## 2020-08-16 ENCOUNTER — Other Ambulatory Visit: Payer: Self-pay

## 2020-08-16 ENCOUNTER — Other Ambulatory Visit
Admission: RE | Admit: 2020-08-16 | Discharge: 2020-08-16 | Disposition: A | Discharge status: Home / Self Care | Payer: BC Managed Care – PPO | Source: Ambulatory Visit | Attending: Gastroenterology | Admitting: Gastroenterology

## 2020-08-16 DIAGNOSIS — Z20822 Contact with and (suspected) exposure to covid-19: Secondary | ICD-10-CM | POA: Insufficient documentation

## 2020-08-16 DIAGNOSIS — Z01812 Encounter for preprocedural laboratory examination: Secondary | ICD-10-CM | POA: Diagnosis not present

## 2020-08-16 LAB — SARS CORONAVIRUS 2 (TAT 6-24 HRS): SARS Coronavirus 2: NEGATIVE

## 2020-08-16 NOTE — Discharge Instructions (Signed)
General Anesthesia, Adult, Care After This sheet gives you information about how to care for yourself after your procedure. Your health care provider may also give you more specific instructions. If you have problems or questions, contact your health care provider. What can I expect after the procedure? After the procedure, the following side effects are common:  Pain or discomfort at the IV site.  Nausea.  Vomiting.  Sore throat.  Trouble concentrating.  Feeling cold or chills.  Weak or tired.  Sleepiness and fatigue.  Soreness and body aches. These side effects can affect parts of the body that were not involved in surgery. Follow these instructions at home:  For at least 24 hours after the procedure:  Have a responsible adult stay with you. It is important to have someone help care for you until you are awake and alert.  Rest as needed.  Do not: ? Participate in activities in which you could fall or become injured. ? Drive. ? Use heavy machinery. ? Drink alcohol. ? Take sleeping pills or medicines that cause drowsiness. ? Make important decisions or sign legal documents. ? Take care of children on your own. Eating and drinking  Follow any instructions from your health care provider about eating or drinking restrictions.  When you feel hungry, start by eating small amounts of foods that are soft and easy to digest (bland), such as toast. Gradually return to your regular diet.  Drink enough fluid to keep your urine pale yellow.  If you vomit, rehydrate by drinking water, juice, or clear broth. General instructions  If you have sleep apnea, surgery and certain medicines can increase your risk for breathing problems. Follow instructions from your health care provider about wearing your sleep device: ? Anytime you are sleeping, including during daytime naps. ? While taking prescription pain medicines, sleeping medicines, or medicines that make you drowsy.  Return to  your normal activities as told by your health care provider. Ask your health care provider what activities are safe for you.  Take over-the-counter and prescription medicines only as told by your health care provider.  If you smoke, do not smoke without supervision.  Keep all follow-up visits as told by your health care provider. This is important. Contact a health care provider if:  You have nausea or vomiting that does not get better with medicine.  You cannot eat or drink without vomiting.  You have pain that does not get better with medicine.  You are unable to pass urine.  You develop a skin rash.  You have a fever.  You have redness around your IV site that gets worse. Get help right away if:  You have difficulty breathing.  You have chest pain.  You have blood in your urine or stool, or you vomit blood. Summary  After the procedure, it is common to have a sore throat or nausea. It is also common to feel tired.  Have a responsible adult stay with you for the first 24 hours after general anesthesia. It is important to have someone help care for you until you are awake and alert.  When you feel hungry, start by eating small amounts of foods that are soft and easy to digest (bland), such as toast. Gradually return to your regular diet.  Drink enough fluid to keep your urine pale yellow.  Return to your normal activities as told by your health care provider. Ask your health care provider what activities are safe for you. This information is not   intended to replace advice given to you by your health care provider. Make sure you discuss any questions you have with your health care provider. Document Revised: 11/06/2017 Document Reviewed: 06/19/2017 Elsevier Patient Education  2020 Elsevier Inc.  

## 2020-08-17 ENCOUNTER — Other Ambulatory Visit: Payer: BC Managed Care – PPO

## 2020-08-20 ENCOUNTER — Ambulatory Visit
Admission: RE | Admit: 2020-08-20 | Discharge: 2020-08-20 | Disposition: A | Discharge status: Home / Self Care | Payer: BC Managed Care – PPO | Attending: Gastroenterology | Admitting: Gastroenterology

## 2020-08-20 ENCOUNTER — Encounter: Payer: Self-pay | Admitting: Gastroenterology

## 2020-08-20 ENCOUNTER — Encounter
Admission: RE | Disposition: A | Discharge status: Home / Self Care | Payer: Self-pay | Source: Home / Self Care | Attending: Gastroenterology

## 2020-08-20 ENCOUNTER — Ambulatory Visit: Payer: BC Managed Care – PPO | Admitting: Anesthesiology

## 2020-08-20 ENCOUNTER — Other Ambulatory Visit: Payer: Self-pay

## 2020-08-20 DIAGNOSIS — E785 Hyperlipidemia, unspecified: Secondary | ICD-10-CM | POA: Diagnosis not present

## 2020-08-20 DIAGNOSIS — Z8249 Family history of ischemic heart disease and other diseases of the circulatory system: Secondary | ICD-10-CM | POA: Diagnosis not present

## 2020-08-20 DIAGNOSIS — Z1211 Encounter for screening for malignant neoplasm of colon: Secondary | ICD-10-CM

## 2020-08-20 DIAGNOSIS — Z888 Allergy status to other drugs, medicaments and biological substances status: Secondary | ICD-10-CM | POA: Diagnosis not present

## 2020-08-20 DIAGNOSIS — Z6837 Body mass index (BMI) 37.0-37.9, adult: Secondary | ICD-10-CM | POA: Diagnosis not present

## 2020-08-20 DIAGNOSIS — Z9049 Acquired absence of other specified parts of digestive tract: Secondary | ICD-10-CM | POA: Insufficient documentation

## 2020-08-20 DIAGNOSIS — Z800 Family history of malignant neoplasm of digestive organs: Secondary | ICD-10-CM

## 2020-08-20 DIAGNOSIS — Z87891 Personal history of nicotine dependence: Secondary | ICD-10-CM | POA: Diagnosis not present

## 2020-08-20 DIAGNOSIS — Z8371 Family history of colonic polyps: Secondary | ICD-10-CM | POA: Diagnosis not present

## 2020-08-20 DIAGNOSIS — Z881 Allergy status to other antibiotic agents status: Secondary | ICD-10-CM | POA: Diagnosis not present

## 2020-08-20 DIAGNOSIS — Z79899 Other long term (current) drug therapy: Secondary | ICD-10-CM | POA: Insufficient documentation

## 2020-08-20 DIAGNOSIS — I10 Essential (primary) hypertension: Secondary | ICD-10-CM | POA: Diagnosis not present

## 2020-08-20 DIAGNOSIS — K648 Other hemorrhoids: Secondary | ICD-10-CM | POA: Insufficient documentation

## 2020-08-20 DIAGNOSIS — R7303 Prediabetes: Secondary | ICD-10-CM | POA: Diagnosis not present

## 2020-08-20 DIAGNOSIS — E669 Obesity, unspecified: Secondary | ICD-10-CM | POA: Diagnosis not present

## 2020-08-20 HISTORY — DX: Prediabetes: R73.03

## 2020-08-20 HISTORY — PX: COLONOSCOPY WITH PROPOFOL: SHX5780

## 2020-08-20 SURGERY — COLONOSCOPY WITH PROPOFOL
Anesthesia: General | Site: Rectum

## 2020-08-20 MED ORDER — STERILE WATER FOR IRRIGATION IR SOLN: Status: DC | PRN

## 2020-08-20 MED ORDER — LACTATED RINGERS IV SOLN: INTRAVENOUS | Status: DC

## 2020-08-20 MED ORDER — ACETAMINOPHEN 325 MG PO TABS: 325.0000 mg | ORAL_TABLET | Freq: Once | ORAL | Status: DC

## 2020-08-20 MED ORDER — LIDOCAINE HCL (CARDIAC) PF 100 MG/5ML IV SOSY
PREFILLED_SYRINGE | INTRAVENOUS | Status: DC | PRN
  Administered 2020-08-20: 50 mg via INTRAVENOUS

## 2020-08-20 MED ORDER — SODIUM CHLORIDE 0.9 % IV SOLN: INTRAVENOUS | Status: DC

## 2020-08-20 MED ORDER — ACETAMINOPHEN 160 MG/5ML PO SOLN: 325.0000 mg | Freq: Once | ORAL | Status: DC

## 2020-08-20 MED ORDER — PROPOFOL 10 MG/ML IV BOLUS
INTRAVENOUS | Status: DC | PRN
  Administered 2020-08-20: 120 mg via INTRAVENOUS
  Administered 2020-08-20: 30 mg via INTRAVENOUS
  Administered 2020-08-20: 20 mg via INTRAVENOUS
  Administered 2020-08-20 (×2): 30 mg via INTRAVENOUS
  Administered 2020-08-20: 60 mg via INTRAVENOUS

## 2020-08-20 SURGICAL SUPPLY — 5 items
GOWN CVR UNV OPN BCK APRN NK (MISCELLANEOUS) ×2 IMPLANT
GOWN ISOL THUMB LOOP REG UNIV (MISCELLANEOUS) ×4
KIT ENDO PROCEDURE OLY (KITS) ×2 IMPLANT
MANIFOLD NEPTUNE II (INSTRUMENTS) ×2 IMPLANT
WATER STERILE IRR 250ML POUR (IV SOLUTION) ×2 IMPLANT

## 2020-08-20 NOTE — Op Note (Signed)
[facility] Gastroenterology Patient Name: Deanna Potts Procedure Date: 08/20/2020 8:51 AM MRN: [id number] Account #: [id number] Date of Birth: [birth year] Admit Type: Outpatient Age: 44 Room: [facility] OR ROOM 01 Gender: Female Note Status: Finalized Procedure:             Colonoscopy Indications:           Screening for colorectal malignant neoplasm Providers:             Midge Minium MD, MD Referring MD:          Bari Edward, MD (Referring MD) Medicines:             Propofol per Anesthesia Complications:         No immediate complications. Procedure:             Pre-Anesthesia Assessment:                        - Prior to the procedure, a History and Physical was                         performed, and patient medications and allergies were                         reviewed. The patient's tolerance of previous                         anesthesia was also reviewed. The risks and benefits                         of the procedure and the sedation options and risks                         were discussed with the patient. All questions were                         answered, and informed consent was obtained. Prior                         Anticoagulants: The patient has taken no previous                         anticoagulant or antiplatelet agents. ASA Grade                         Assessment: II - A patient with mild systemic disease.                         After reviewing the risks and benefits, the patient                         was deemed in satisfactory condition to undergo the                         procedure.                        After obtaining informed consent, the colonoscope was  passed under direct vision. Throughout the procedure,                         the patient's blood pressure, pulse, and oxygen                         saturations were monitored continuously. The                         Colonoscope was introduced through the  anus and                         advanced to the the cecum, identified by appendiceal                         orifice and ileocecal valve. The colonoscopy was                         performed without difficulty. The patient tolerated                         the procedure well. The quality of the bowel                         preparation was excellent. Findings:      The perianal and digital rectal examinations were normal.      Non-bleeding internal hemorrhoids were found during retroflexion. The       hemorrhoids were Grade I (internal hemorrhoids that do not prolapse). Impression:            - Non-bleeding internal hemorrhoids.                        - No specimens collected. Recommendation:        - Discharge patient to home.                        - Resume previous diet.                        - Continue present medications.                        - Repeat colonoscopy in 10 years for screening unless                         any change in family history or lower GI problems. Procedure Code(s):     --- Professional ---                        [phone], Colonoscopy, flexible; diagnostic, including                         collection of specimen(s) by brushing or washing, when                         performed (separate procedure) Diagnosis Code(s):     --- Professional ---                        Z12.11, Encounter for  screening for malignant neoplasm                         of colon CPT copyright 2019 American Medical Association. All rights reserved. The codes documented in this report are preliminary and upon coder review may  be revised to meet current compliance requirements. Midge Minium MD, MD 08/20/2020 9:16:22 AM This report has been signed electronically. Number of Addenda: 0 Note Initiated On: 08/20/2020 8:51 AM Scope Withdrawal Time: 0 hours 7 minutes 36 seconds  Total Procedure Duration: 0 hours 11 minutes 2 seconds  Estimated Blood Loss:  Estimated blood loss: none.       [facility]

## 2020-08-20 NOTE — Anesthesia Postprocedure Evaluation (Signed)
Anesthesia Post Note  Patient: Deanna Potts  Procedure(s) Performed: COLONOSCOPY WITH PROPOFOL (N/A Rectum)     Patient location during evaluation: PACU Anesthesia Type: General Level of consciousness: awake and alert and oriented Pain management: satisfactory to patient Vital Signs Assessment: post-procedure vital signs reviewed and stable Respiratory status: spontaneous breathing, nonlabored ventilation and respiratory function stable Cardiovascular status: blood pressure returned to baseline and stable Postop Assessment: Adequate PO intake and No signs of nausea or vomiting Anesthetic complications: no   No complications documented.  Cherly [place]

## 2020-08-20 NOTE — H&P (Signed)
Midge Minium, MD [facility] [address], [address] [place], [place] [address] Phone: [phone] Fax : [phone]  Primary Care Physician:  Reubin Milan, MD Primary Gastroenterologist:  Dr. Servando Snare  Pre-Procedure History & Physical: HPI:  Deanna Potts is a 44 y.o. female is here for a screening colonoscopy.   Past Medical History:  Diagnosis Date  . Abnormal liver function   . Allergic rhinitis   . Anemia, iron deficiency   . Cholelithiasis   . Conjunctivitis   . Dysmenorrhea   . Ganglion cyst   . Hemorrhoids   . HTN (hypertension)   . Hyperlipidemia   . LIVER FUNCTION TESTS, ABNORMAL 08/16/2007   Resolved after cholecystectomy   . Miscarriage 2008  . Obesity   . Pre-diabetes     Past Surgical History:  Procedure Laterality Date  . CHOLECYSTECTOMY  2007  . COLONOSCOPY  07/2015  . GANGLION CYST EXCISION     Cyst    Prior to Admission medications   Medication Sig Start Date End Date Taking? Authorizing Provider  amLODipine (NORVASC) 10 MG tablet Take 1 tablet (10 mg total) by mouth daily. 04/26/20  Yes Reubin Milan, MD  Cholecalciferol (VITAMIN D3) 50 MCG (2000 UT) TABS Take by mouth daily.   Yes [provider]  loratadine (CLARITIN) 10 MG tablet Take 10 mg by mouth daily.   Yes [provider]  Multiple Vitamin (MULTIVITAMIN) tablet Take 1 tablet by mouth daily.   Yes [provider]    Allergies as of 08/02/2020 - Review Complete 08/02/2020  Allergen Reaction Noted  . Ceftin [cefuroxime] Diarrhea 04/18/2019  . Betadine [povidone iodine]  06/28/2015  . Ciprofloxacin Hives 02/08/2016  . Neomycin-bacitracin zn-polymyx  08/16/2007    Family History  Problem Relation Age of Onset  . Colon cancer Mother        died at 55  . Diabetes Father   . Heart disease Father   . Aneurysm Father   . Colon polyps Maternal Grandmother        benign but still had colectomy  . Heart disease Paternal Grandfather   . Kidney disease Neg Hx    . Bladder Cancer Neg Hx   . Breast cancer Neg Hx     Social History   Socioeconomic History  . Marital status: Married    Spouse name: Not on file  . Number of children: 2  . Years of education: Not on file  . Highest education level: Not on file  Occupational History  . Occupation: Consulting civil engineer  Tobacco Use  . Smoking status: Former Smoker    Packs/day: 1.00    Years: 12.00    Pack years: 12.00    Types: Cigarettes    Quit date: 07/18/2005    Years since quitting: 15.1  . Smokeless tobacco: Never Used  Vaping Use  . Vaping Use: Never used  Substance and Sexual Activity  . Alcohol use: No    Alcohol/week: 0.0 standard drinks  . Drug use: No  . Sexual activity: Not on file  Other Topics Concern  . 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 Difficulty of Paying Living Expenses: Not on file  Food Insecurity:   . Worried About Programme researcher, broadcasting/film/video  in the Last Year: Not on file  . Ran Out of Food in the Last Year: Not on file  Transportation Needs:   . Lack of Transportation (Medical): Not on file  . Lack of Transportation (Non-Medical): Not on file  Physical Activity:   . Days of Exercise per Week: Not on file  . Minutes of Exercise per Session: Not on file  Stress:   . Feeling of Stress : Not on file  Social Connections:   . Frequency of Communication with Friends and Family: Not on file  . Frequency of Social Gatherings with Friends and Family: Not on file  . Attends Religious Services: Not on file  . Active Member of Clubs or Organizations: Not on file  . Attends Banker Meetings: Not on file  . Marital Status: Not on file  Intimate Partner Violence:   . Fear of Current or Ex-Partner: Not on file  . Emotionally Abused: Not on file  . Physically  Abused: Not on file  . Sexually Abused: Not on file    Review of Systems: See HPI, otherwise negative ROS  Physical Exam: BP (!) 142/79   Pulse 81   Temp 98.1 F (36.7 C) (Temporal)   Resp 16   Ht 5\' 8"  (1.727 m)   Wt 112.5 kg   LMP 05/17/2020 (Approximate) Comment: upreg  SpO2 100%   BMI 37.71 kg/m  General:   Alert,  pleasant and cooperative in NAD Head:  Normocephalic and atraumatic. Neck:  Supple; no masses or thyromegaly. Lungs:  Clear throughout to auscultation.    Heart:  Regular rate and rhythm. Abdomen:  Soft, nontender and nondistended. Normal bowel sounds, without guarding, and without rebound.   Neurologic:  Alert and  oriented x4;  grossly normal neurologically.  Impression/Plan: Deanna Potts is now here to undergo a screening colonoscopy.  Risks, benefits, and alternatives regarding colonoscopy have been reviewed with the patient.  Questions have been answered.  All parties agreeable.

## 2020-08-20 NOTE — Anesthesia Procedure Notes (Signed)
Date/Time: 08/20/2020 8:59 AM Performed by: Maree Krabbe, CRNA Pre-anesthesia Checklist: Patient identified, Emergency Drugs available, Suction available, Timeout performed and Patient being monitored Patient Re-evaluated:Patient Re-evaluated prior to induction Oxygen Delivery Method: Nasal cannula Placement Confirmation: positive ETCO2

## 2020-08-20 NOTE — Transfer of Care (Signed)
Immediate Anesthesia Transfer of Care Note  Patient: Deanna Potts  Procedure(s) Performed: COLONOSCOPY WITH PROPOFOL (N/A Rectum)  Patient Location: PACU  Anesthesia Type: General  Level of Consciousness: awake, alert  and patient cooperative  Airway and Oxygen Therapy: Patient Spontanous Breathing and Patient connected to supplemental oxygen  Post-op Assessment: Post-op Vital signs reviewed, Patient's Cardiovascular Status Stable, Respiratory Function Stable, Patent Airway and No signs of Nausea or vomiting  Post-op Vital Signs: Reviewed and stable  Complications: No complications documented.

## 2020-08-20 NOTE — Anesthesia Preprocedure Evaluation (Signed)
Anesthesia Evaluation  Patient identified by MRN, date of birth, ID band Patient awake    Reviewed: Allergy & Precautions, H&P , NPO status , Patient's Chart, lab work & pertinent test results  Airway Mallampati: III  TM Distance: >3 FB Neck ROM: full    Dental no notable dental hx.    Pulmonary former smoker,    Pulmonary exam normal breath sounds clear to auscultation       Cardiovascular hypertension, Normal cardiovascular exam Rhythm:regular Rate:Normal     Neuro/Psych    GI/Hepatic   Endo/Other    Renal/GU      Musculoskeletal   Abdominal   Peds  Hematology   Anesthesia Other Findings   Reproductive/Obstetrics                             Anesthesia Physical Anesthesia Plan  ASA: II  Anesthesia Plan: General   Post-op Pain Management:    Induction: Intravenous  PONV Risk Score and Plan: 3 and Treatment may vary due to age or medical condition, TIVA and Propofol infusion  Airway Management Planned: Natural Airway  Additional Equipment:   Intra-op Plan:   Post-operative Plan:   Informed Consent: I have reviewed the patients History and Physical, chart, labs and discussed the procedure including the risks, benefits and alternatives for the proposed anesthesia with the patient or authorized representative who has indicated his/her understanding and acceptance.     Dental Advisory Given  Plan Discussed with: CRNA  Anesthesia Plan Comments:         Anesthesia Quick Evaluation

## 2020-08-21 ENCOUNTER — Encounter: Payer: Self-pay | Admitting: Gastroenterology

## 2020-08-27 ENCOUNTER — Ambulatory Visit: Payer: BC Managed Care – PPO | Admitting: Internal Medicine

## 2020-08-27 ENCOUNTER — Encounter: Payer: Self-pay | Admitting: Internal Medicine

## 2020-08-27 ENCOUNTER — Other Ambulatory Visit: Payer: Self-pay

## 2020-08-27 VITALS — BP 118/84 | HR 86 | Temp 97.9°F | Ht 68.0 in | Wt 256.0 lb

## 2020-08-27 DIAGNOSIS — N3001 Acute cystitis with hematuria: Secondary | ICD-10-CM | POA: Diagnosis not present

## 2020-08-27 LAB — POCT URINALYSIS DIPSTICK
Bilirubin, UA: NEGATIVE
Glucose, UA: NEGATIVE
Ketones, UA: NEGATIVE
Nitrite, UA: NEGATIVE
Protein, UA: NEGATIVE
Spec Grav, UA: 1.01 (ref 1.010–1.025)
Urobilinogen, UA: 0.2 E.U./dL
pH, UA: 6 (ref 5.0–8.0)

## 2020-08-27 MED ORDER — NITROFURANTOIN MONOHYD MACRO 100 MG PO CAPS: 100.0000 mg | ORAL_CAPSULE | Freq: Two times a day (BID) | ORAL | 0 refills | Status: AC

## 2020-08-27 MED ORDER — NITROFURANTOIN MONOHYD MACRO 100 MG PO CAPS: 100.0000 mg | ORAL_CAPSULE | Freq: Two times a day (BID) | ORAL | 0 refills | Status: DC

## 2020-08-27 NOTE — Progress Notes (Signed)
Date:  08/27/2020   Name:  Deanna Potts   DOB:  April 27, 1976   MRN:  [id number]   Chief Complaint: Urinary Tract Infection (X4 days, freq. urination, cloudy urine, burns, smells)  Urinary Tract Infection  This is a new problem. The current episode started in the past 7 days. The problem occurs every urination. The problem has been gradually worsening. The quality of the pain is described as burning. The pain is mild. There has been no fever. Associated symptoms include frequency and urgency. Pertinent negatives include no chills, hematuria, nausea or vomiting. She has tried increased fluids for the symptoms.    Lab Results  Component Value Date   CREATININE 0.77 11/16/2019   BUN 9 11/16/2019   NA 141 11/16/2019   K 4.6 11/16/2019   CL 101 11/16/2019   CO2 25 11/16/2019   Lab Results  Component Value Date   CHOL 207 (H) 11/16/2019   HDL 41 11/16/2019   LDLCALC 141 (H) 11/16/2019   LDLDIRECT 134.7 08/26/2007   TRIG 140 11/16/2019   CHOLHDL 5.0 (H) 11/16/2019   Lab Results  Component Value Date   TSH 0.920 11/16/2019   Lab Results  Component Value Date   HGBA1C 5.7 (H) 11/16/2019   Lab Results  Component Value Date   WBC 6.5 11/16/2019   HGB 13.9 11/16/2019   HCT 42.5 11/16/2019   MCV 81 11/16/2019   PLT 363 11/16/2019   Lab Results  Component Value Date   ALT 34 (H) 11/16/2019   AST 19 11/16/2019   ALKPHOS 92 11/16/2019   BILITOT 0.5 11/16/2019     Review of Systems  Constitutional: Negative for chills, fatigue and fever.  Respiratory: Negative for chest tightness and shortness of breath.   Cardiovascular: Negative for chest pain.  Gastrointestinal: Negative for nausea and vomiting.  Genitourinary: Positive for dysuria, frequency and urgency. Negative for hematuria.    Patient Active Problem List   Diagnosis Date Noted  . Encounter for screening colonoscopy   . Pre-diabetes 11/07/2018  . Mixed hyperlipidemia 11/05/2018  . Hematuria, undiagnosed  cause 05/30/2016  . Environmental and seasonal allergies 02/08/2016  . Essential hypertension 02/08/2016  . Family history of colon cancer in mother 08/06/2015  . ALLERGIC RHINITIS 08/16/2007  . Dysmenorrhea 08/16/2007    Allergies  Allergen Reactions  . Ceftin [Cefuroxime] Diarrhea  . Betadine [Povidone Iodine] Itching  . Ciprofloxacin Hives  . Neomycin-Bacitracin Zn-Polymyx Itching         Past Surgical History:  Procedure Laterality Date  . CHOLECYSTECTOMY  2007  . COLONOSCOPY  07/2015  . COLONOSCOPY WITH PROPOFOL N/A 08/20/2020   Procedure: COLONOSCOPY WITH PROPOFOL;  Surgeon: Midge Minium, MD;  Location: [facility];  Service: Endoscopy;  Laterality: N/A;  priority 4  . GANGLION CYST EXCISION     Cyst    Social History   Tobacco Use  . Smoking status: Former Smoker    Packs/day: 1.00    Years: 12.00    Pack years: 12.00    Types: Cigarettes    Quit date: 07/18/2005    Years since quitting: 15.1  . Smokeless tobacco: Never Used  Vaping Use  . Vaping Use: Never used  Substance Use Topics  . Alcohol use: No    Alcohol/week: 0.0 standard drinks  . Drug use: No     Medication list has been reviewed and updated.  Current Meds  Medication Sig  . amLODipine (NORVASC) 10 MG tablet Take 1 tablet (10  mg total) by mouth daily.  . Cholecalciferol (VITAMIN D3) 50 MCG (2000 UT) TABS Take by mouth daily.  Marland Kitchen loratadine (CLARITIN) 10 MG tablet Take 10 mg by mouth daily.  . Multiple Vitamin (MULTIVITAMIN) tablet Take 1 tablet by mouth daily.    PHQ 2/9 Scores 11/16/2019 09/29/2019 04/15/2019 02/22/2019  PHQ - 2 Score 0 0 0 0    No flowsheet data found.  BP Readings from Last 3 Encounters:  08/27/20 118/84  08/20/20 120/78  11/16/19 108/77    Physical Exam Vitals and nursing note reviewed.  Constitutional:      Appearance: Normal appearance. She is well-developed.  Cardiovascular:     Rate and Rhythm: Normal rate and regular rhythm.     Heart sounds:  Normal heart sounds.  Pulmonary:     Effort: Pulmonary effort is normal. No respiratory distress.     Breath sounds: Normal breath sounds.  Abdominal:     General: Bowel sounds are normal.     Palpations: Abdomen is soft.     Tenderness: There is abdominal tenderness in the suprapubic area. There is no right CVA tenderness, left CVA tenderness, guarding or rebound.  Musculoskeletal:     Right lower leg: Edema (trace) present.     Left lower leg: Edema (trace) present.  Neurological:     Mental Status: She is alert.     Wt Readings from Last 3 Encounters:  08/27/20 256 lb (116.1 kg)  08/20/20 248 lb (112.5 kg)  11/16/19 222 lb (100.7 kg)    BP 118/84 (BP Location: Left Arm, Patient Position: Sitting)   Pulse 86   Temp 97.9 F (36.6 C) (Oral)   Ht 5\' 8"  (1.727 m)   Wt 256 lb (116.1 kg)   SpO2 97%   BMI 38.92 kg/m   Assessment and Plan: 1. Acute cystitis with hematuria Continue increased fluids - POCT urinalysis dipstick - nitrofurantoin, macrocrystal-monohydrate, (MACROBID) 100 MG capsule; Take 1 capsule (100 mg total) by mouth 2 (two) times daily for 7 days.  Dispense: 14 capsule; Refill: 0   Partially dictated using . Any errors are unintentional.  Animal nutritionist, MD [facility]  08/27/2020

## 2020-09-04 ENCOUNTER — Encounter: Payer: Self-pay | Admitting: Internal Medicine

## 2020-09-04 ENCOUNTER — Other Ambulatory Visit: Payer: Self-pay | Admitting: Internal Medicine

## 2020-09-04 DIAGNOSIS — N3000 Acute cystitis without hematuria: Secondary | ICD-10-CM

## 2020-09-04 MED ORDER — AMOXICILLIN-POT CLAVULANATE 875-125 MG PO TABS: 1.0000 | ORAL_TABLET | Freq: Two times a day (BID) | ORAL | 0 refills | Status: AC

## 2020-09-04 NOTE — Telephone Encounter (Signed)
Please Advise.  KP

## 2020-09-10 ENCOUNTER — Encounter: Payer: Self-pay | Admitting: Internal Medicine

## 2020-10-20 ENCOUNTER — Other Ambulatory Visit: Payer: Self-pay | Admitting: Internal Medicine

## 2020-10-20 NOTE — Telephone Encounter (Signed)
Requested Prescriptions  Pending Prescriptions Disp Refills  . amLODipine (NORVASC) 10 MG tablet [Pharmacy Med Name: AMLODIPINE BESYLATE TABS 10MG ] 90 tablet 0    Sig: TAKE 1 TABLET DAILY     Cardiovascular:  Calcium Channel Blockers Passed - 10/20/2020  8:36 AM      Passed - Last BP in normal range    BP Readings from Last 1 Encounters:  08/27/20 118/84         Passed - Valid encounter within last 6 months    Recent Outpatient Visits          1 month ago Acute cystitis with hematuria   [facility], MD   11 months ago Annual physical exam   [facility], MD   1 year ago Yeast UTI   [facility], MD   1 year ago Acute cystitis with hematuria   [facility], MD   1 year ago Acute cystitis without hematuria   [facility], MD      Future Appointments            In 1 month Reubin Milan Judithann Graves, MD [facility]

## 2020-11-19 ENCOUNTER — Telehealth: Payer: Self-pay

## 2020-11-19 NOTE — Telephone Encounter (Signed)
Called patient and left VM asking that patient call the office to reschedule her CPE scheduled for this Wednesday at 8am. Dr. Judithann Graves has a family emergency and needs to be out Wednesday through Friday of this week. We need to reschedule her CPE for another day.   Awaiting patients call back to reschedule. CRM created.

## 2020-11-20 IMAGING — MG DIGITAL DIAGNOSTIC UNILATERAL RIGHT MAMMOGRAM WITH TOMO AND CAD
4 series · 4 of 12 positions shown · non-contrast
Comparison: Previous exam(s).

CLINICAL DATA: Screening recall for a possible right breast mass.

EXAM:
DIGITAL DIAGNOSTIC RIGHT MAMMOGRAM WITH CAD AND TOMO
ULTRASOUND RIGHT BREAST

[R MLO synth-2D]
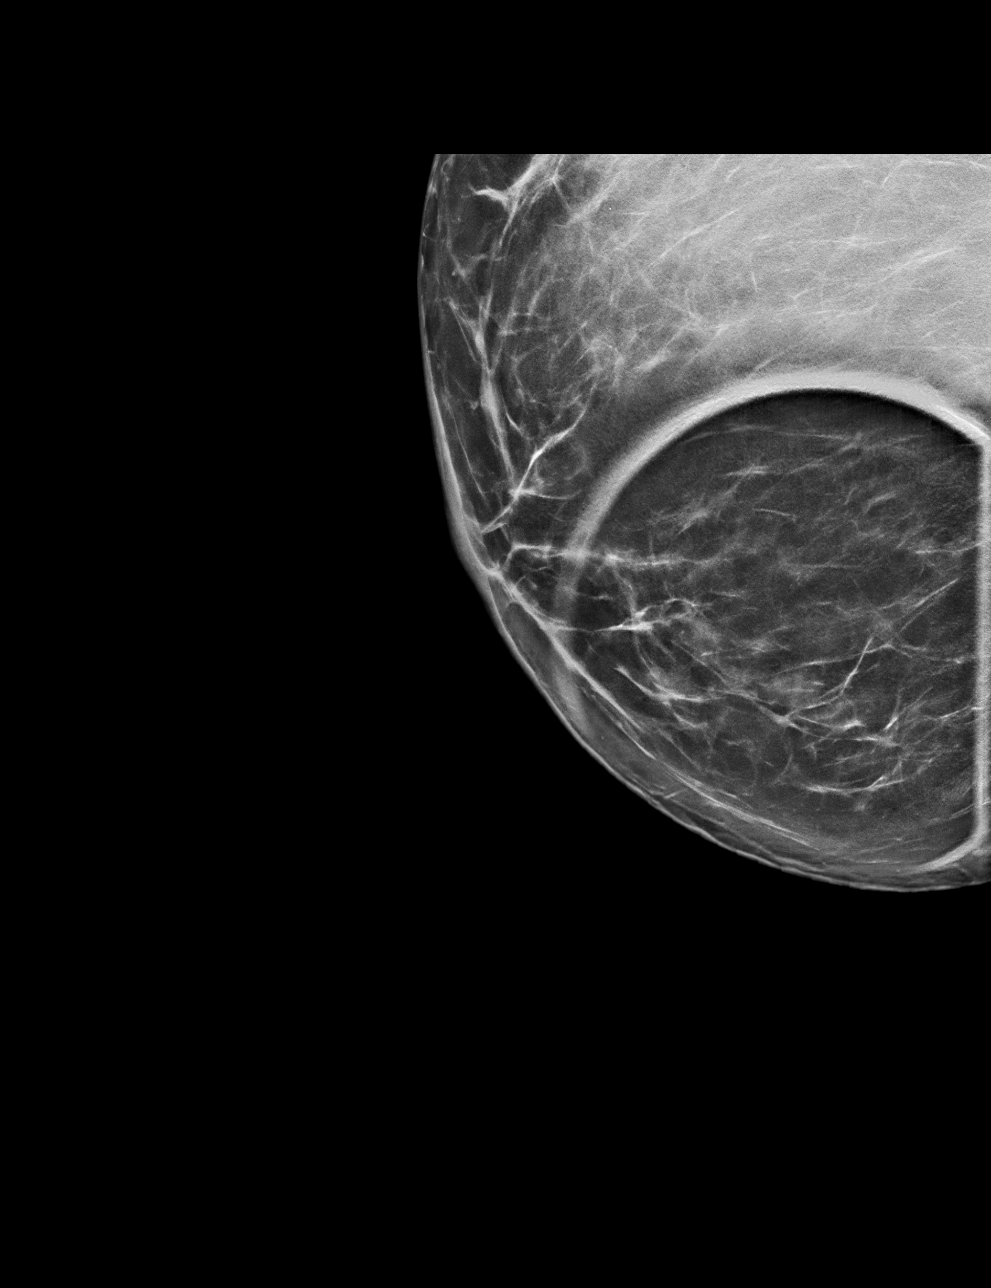

[R CC synth-2D]
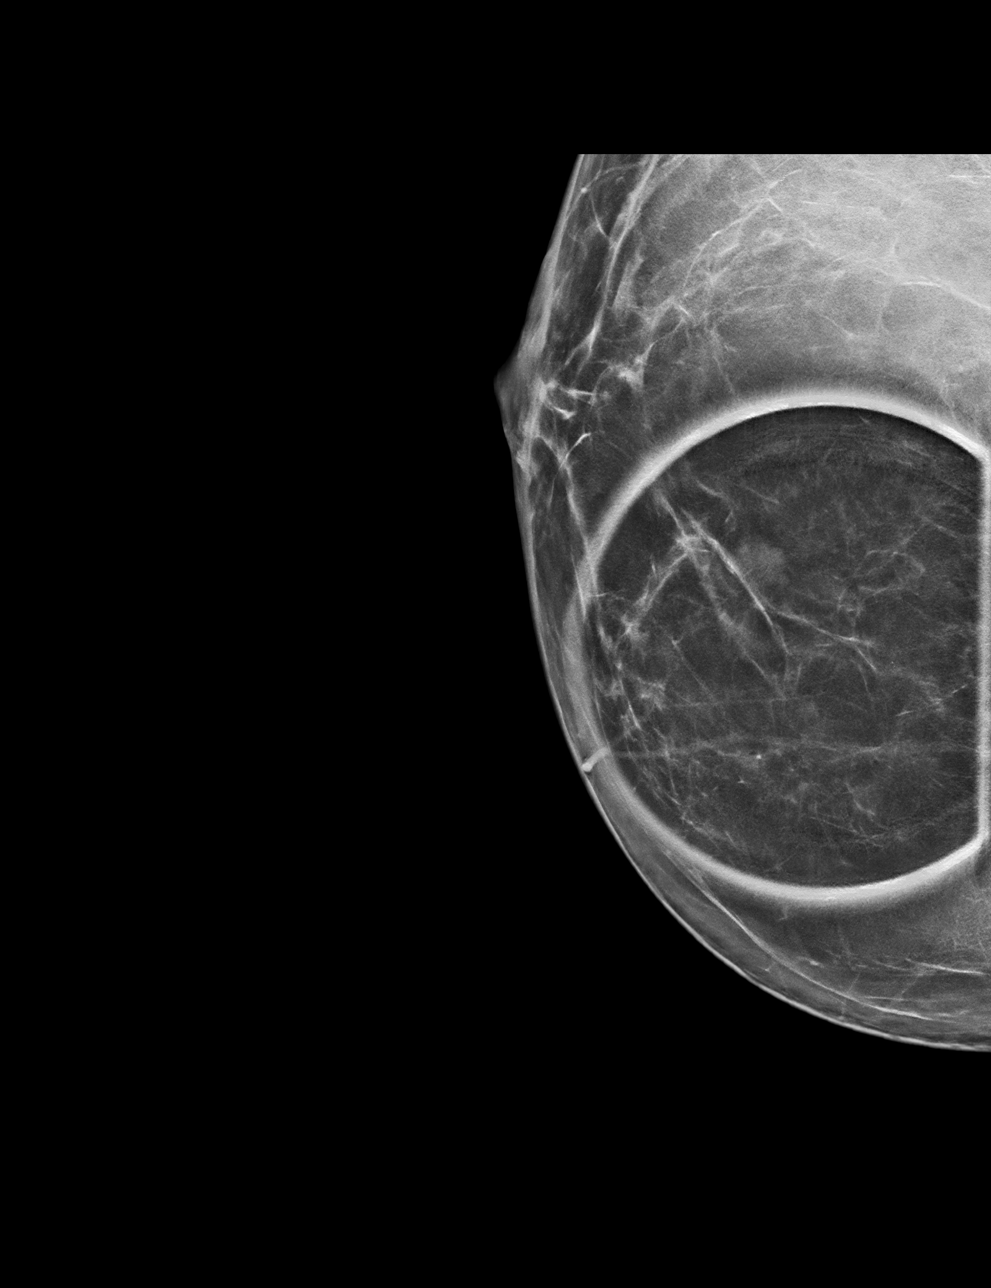

[R CC tomo · tomo slice 34/67.0]
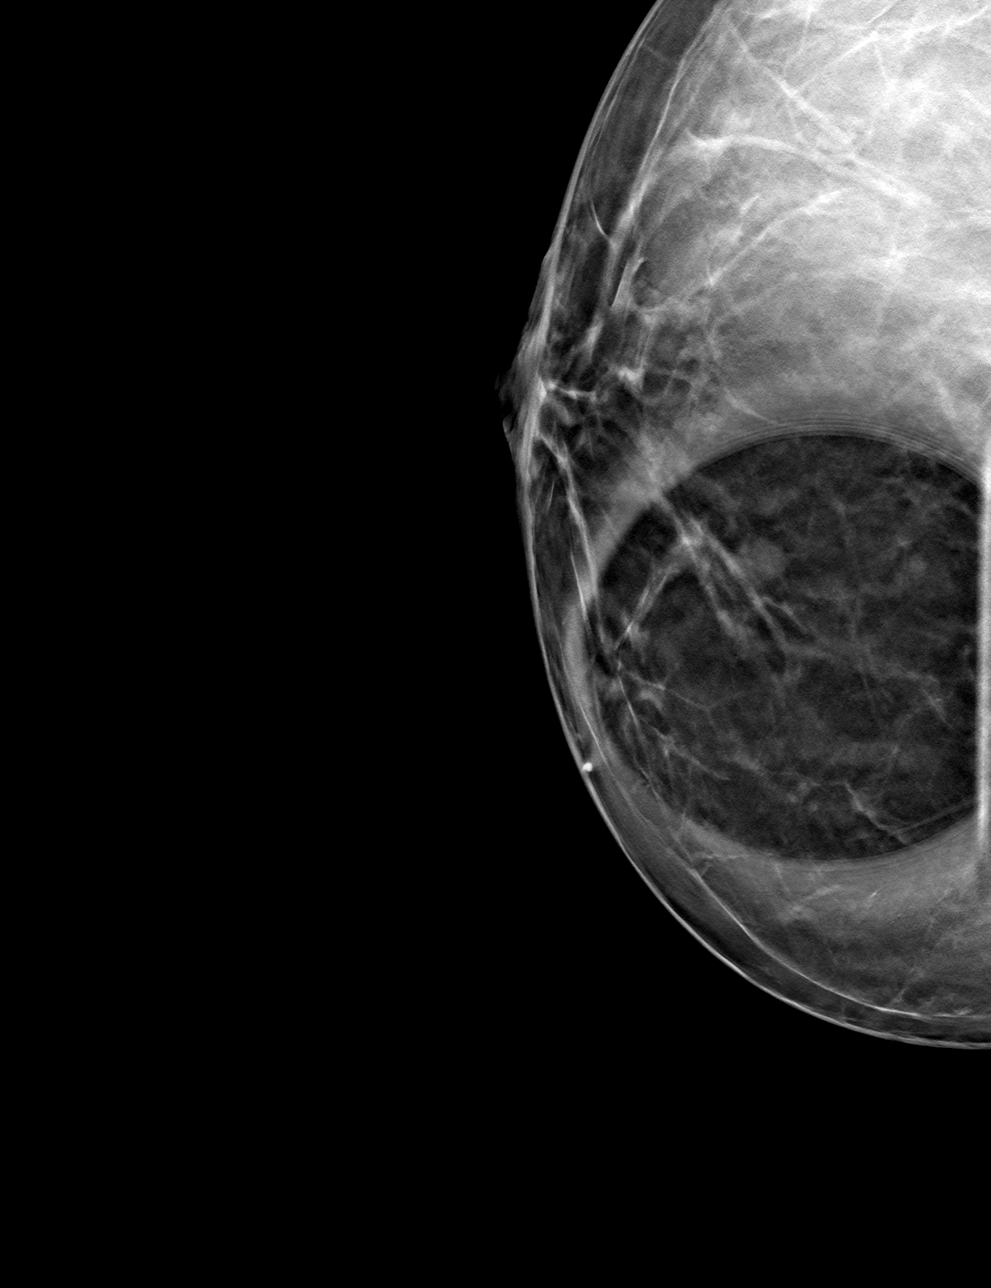

[R MLO tomo · tomo slice 43/86.0]
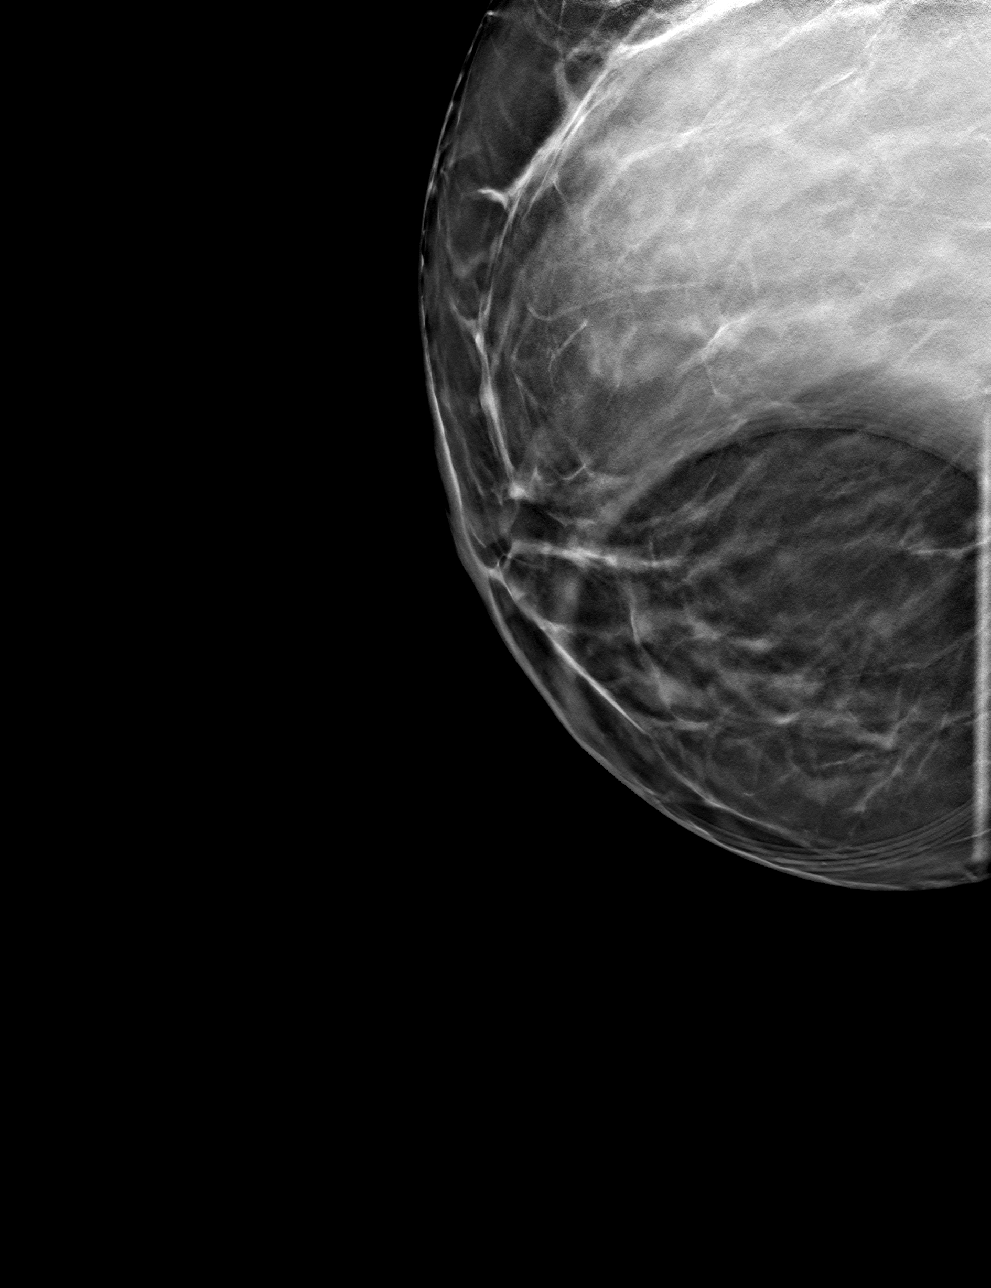

[4 of 12 positions shown; findings below may reference images not displayed]

ACR Breast Density Category b: There are scattered areas of
fibroglandular density.
FINDINGS: There is a persistent asymmetry in the lower inner quadrant of right
breast, anterior depth.

Mammographic images were processed with CAD.

On physical exam, no suspicious palpable masses are identified the
lower-inner right breast.

Targeted ultrasound is performed, showing a cluster of anechoic
cysts in the right breast at 5 o'clock, 2 cm from the nipple. In
total, this area spans approximately 1 cm.
IMPRESSION: The mass in the lower-inner quadrant of the right breast corresponds
with fibrocystic change.

RECOMMENDATION:
Screening mammogram in one year.(Code:M0-V-QDM)

I have discussed the findings and recommendations with the patient.
Results were also provided in writing at the conclusion of the
visit. If applicable, a reminder letter will be sent to the patient
regarding the next appointment.

BI-RADS CATEGORY  2: Benign.

## 2020-11-20 IMAGING — US ULTRASOUND RIGHT BREAST LIMITED
1 series · 6 of 6 positions shown · non-contrast
Comparison: Previous exam(s).

CLINICAL DATA: Screening recall for a possible right breast mass.

EXAM:
DIGITAL DIAGNOSTIC RIGHT MAMMOGRAM WITH CAD AND TOMO
ULTRASOUND RIGHT BREAST

[Series 1: ultrasound right breast limited · 0.06mm/px · 6 of 6 slices shown]
[im 1/6]
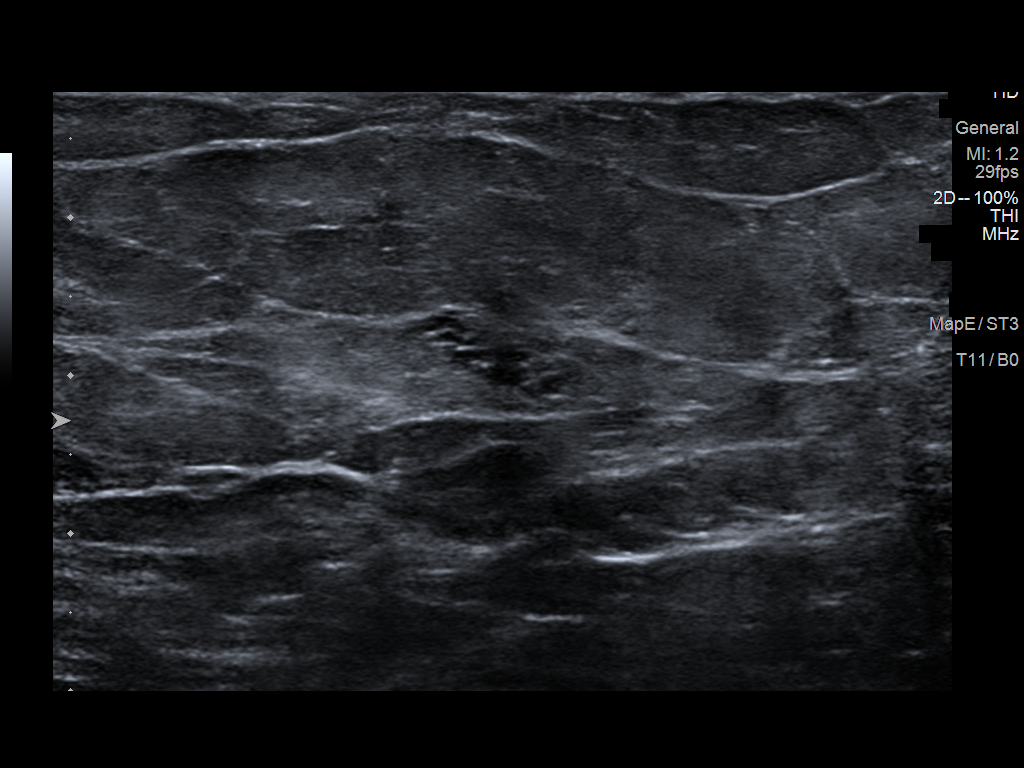
[im 2/6]
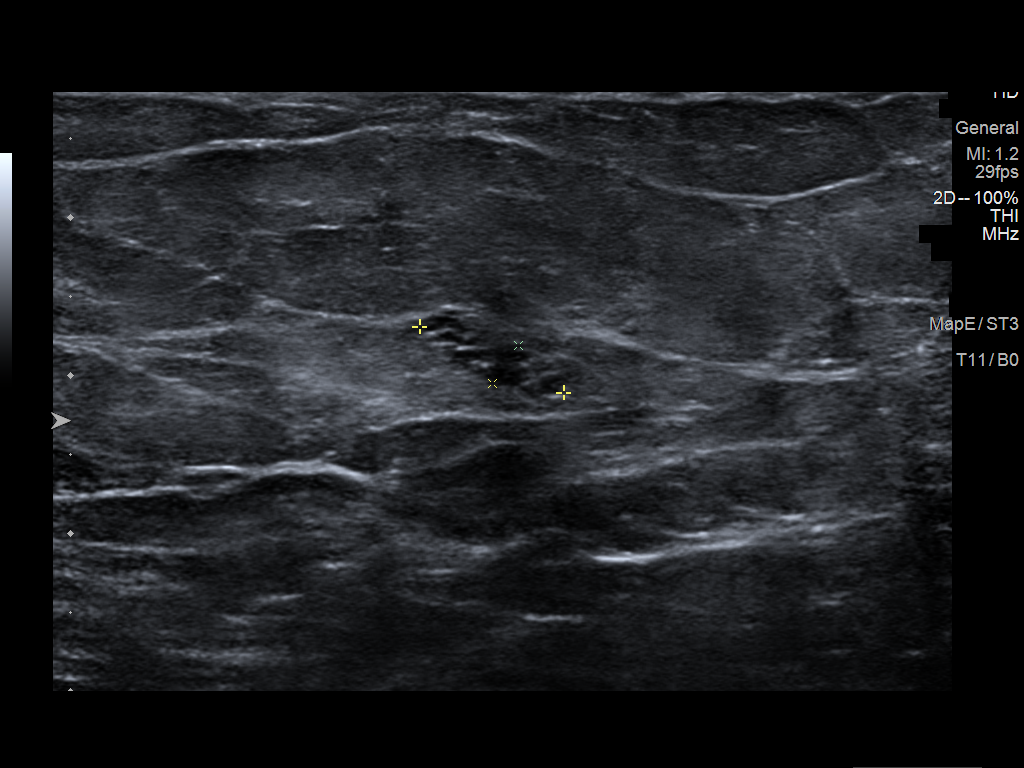
[im 3/6]
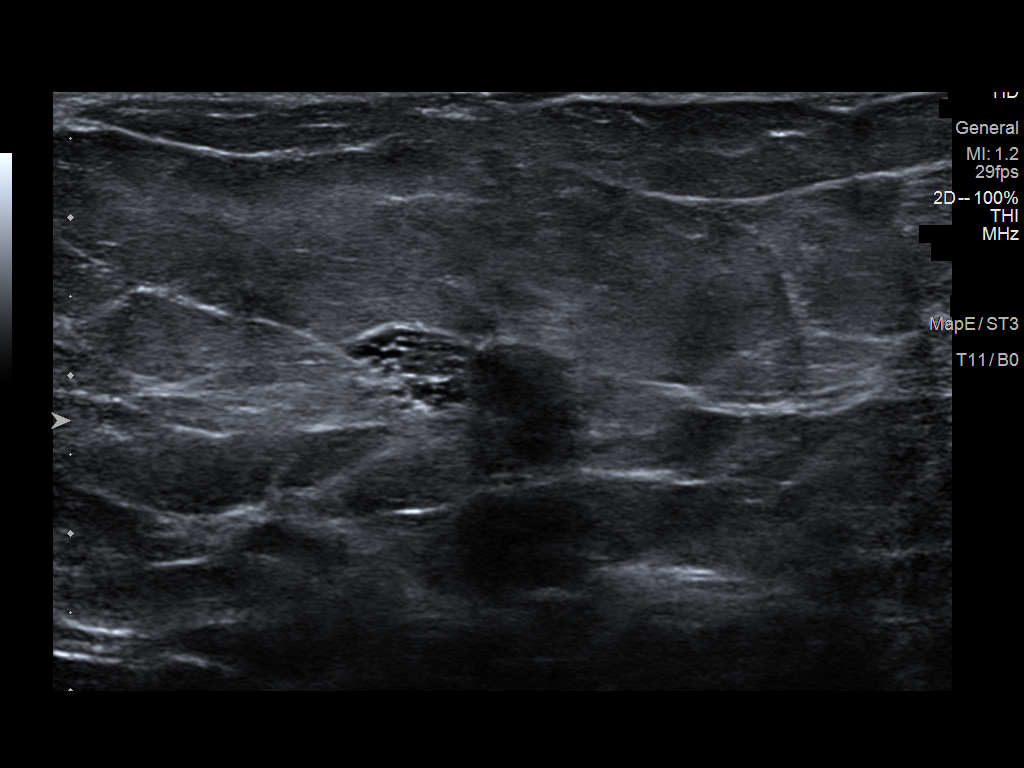
[im 4/6]
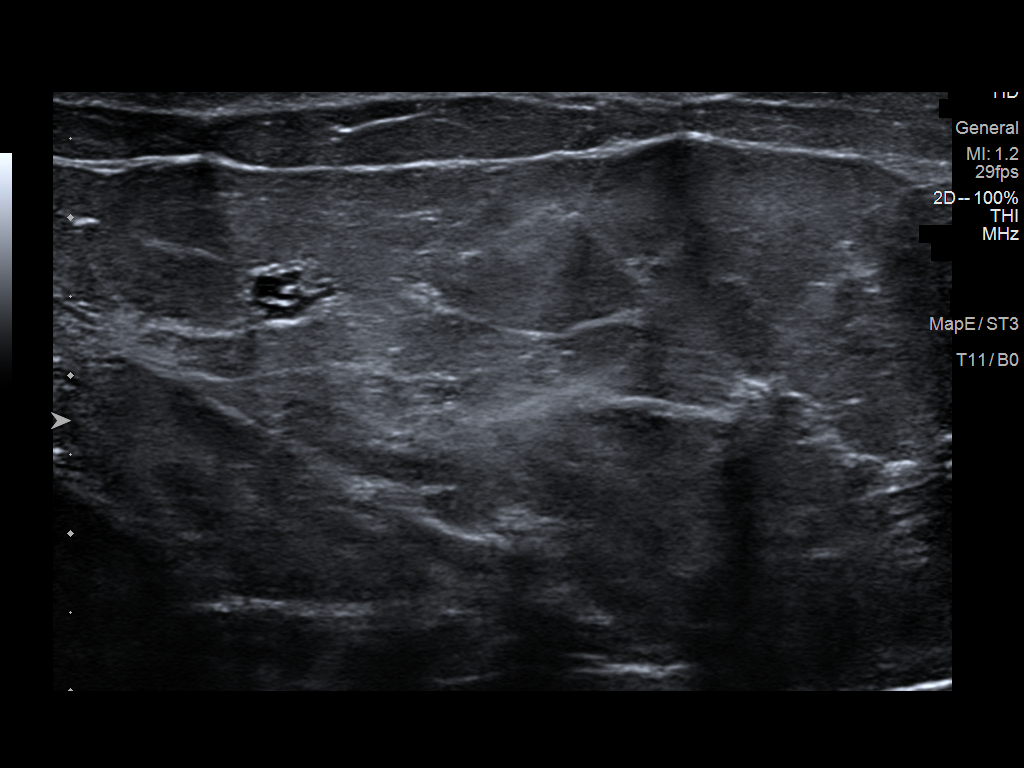
[im 5/6]
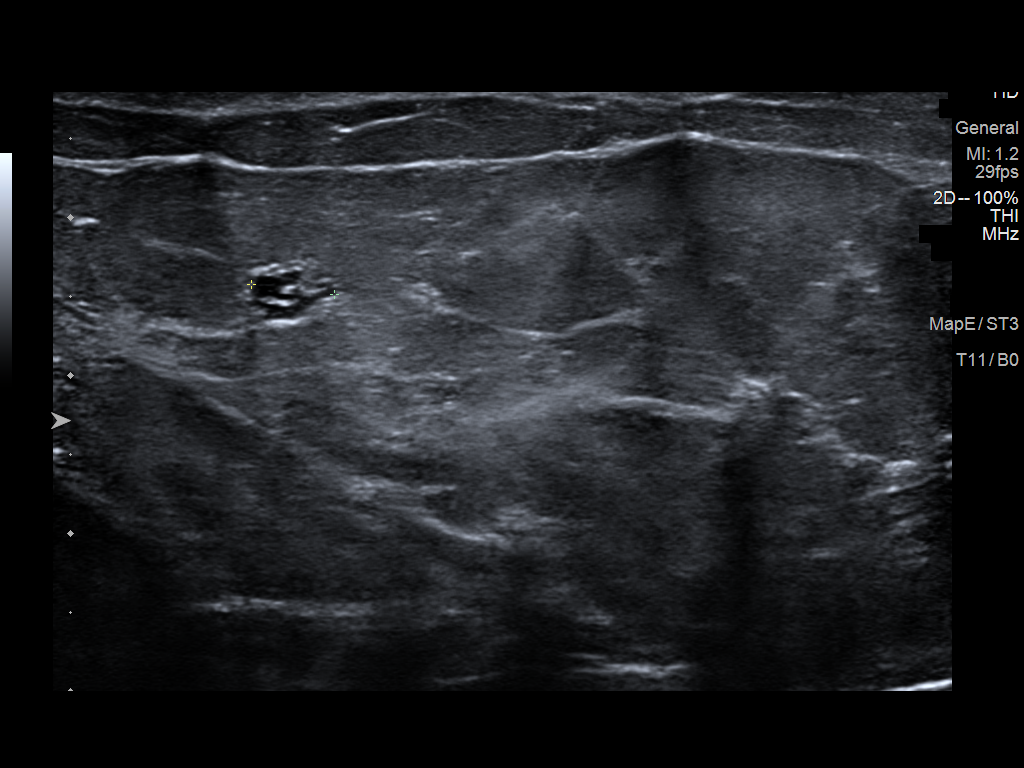
[im 6/6]
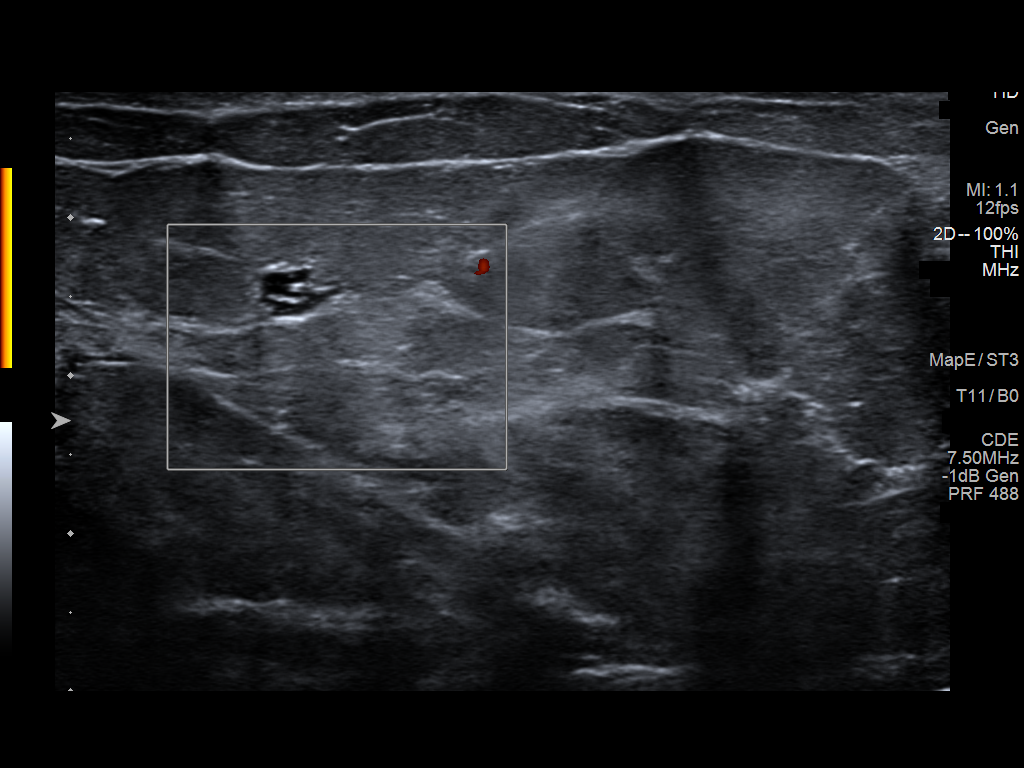

[6 of 6 positions shown; findings below may reference images not displayed]

ACR Breast Density Category b: There are scattered areas of
fibroglandular density.
FINDINGS: There is a persistent asymmetry in the lower inner quadrant of right
breast, anterior depth.

Mammographic images were processed with CAD.

On physical exam, no suspicious palpable masses are identified the
lower-inner right breast.

Targeted ultrasound is performed, showing a cluster of anechoic
cysts in the right breast at 5 o'clock, 2 cm from the nipple. In
total, this area spans approximately 1 cm.
IMPRESSION: The mass in the lower-inner quadrant of the right breast corresponds
with fibrocystic change.

RECOMMENDATION:
Screening mammogram in one year.(Code:M0-V-QDM)

I have discussed the findings and recommendations with the patient.
Results were also provided in writing at the conclusion of the
visit. If applicable, a reminder letter will be sent to the patient
regarding the next appointment.

BI-RADS CATEGORY  2: Benign.

## 2020-11-21 ENCOUNTER — Encounter: Payer: BC Managed Care – PPO | Admitting: Internal Medicine

## 2020-12-13 ENCOUNTER — Encounter: Payer: BC Managed Care – PPO | Admitting: Internal Medicine

## 2020-12-31 ENCOUNTER — Other Ambulatory Visit: Payer: Self-pay | Admitting: Internal Medicine

## 2021-01-25 ENCOUNTER — Encounter: Payer: Self-pay | Admitting: Internal Medicine

## 2021-01-25 ENCOUNTER — Other Ambulatory Visit: Payer: Self-pay

## 2021-01-25 DIAGNOSIS — B3749 Other urogenital candidiasis: Secondary | ICD-10-CM

## 2021-01-25 MED ORDER — FLUCONAZOLE 100 MG PO TABS: 100.0000 mg | ORAL_TABLET | Freq: Once | ORAL | 0 refills | Status: AC

## 2021-01-29 ENCOUNTER — Other Ambulatory Visit: Payer: Self-pay

## 2021-01-29 ENCOUNTER — Encounter: Payer: Self-pay | Admitting: Internal Medicine

## 2021-01-29 ENCOUNTER — Ambulatory Visit: Payer: BC Managed Care – PPO | Admitting: Internal Medicine

## 2021-01-29 VITALS — BP 136/84 | HR 97 | Ht 68.0 in | Wt 276.0 lb

## 2021-01-29 DIAGNOSIS — N760 Acute vaginitis: Secondary | ICD-10-CM | POA: Diagnosis not present

## 2021-01-29 LAB — POCT WET PREP WITH KOH
KOH Prep POC: NEGATIVE
Trichomonas, UA: NEGATIVE
Yeast Wet Prep HPF POC: NEGATIVE

## 2021-01-29 LAB — POCT URINALYSIS DIPSTICK
Bilirubin, UA: NEGATIVE
Blood, UA: NEGATIVE
Glucose, UA: NEGATIVE
Ketones, UA: NEGATIVE
Leukocytes, UA: NEGATIVE
Nitrite, UA: NEGATIVE
Protein, UA: NEGATIVE
Spec Grav, UA: <=1.005 — AB (ref 1.010–1.025)
Urobilinogen, UA: 0.2 E.U./dL
pH, UA: 6 (ref 5.0–8.0)

## 2021-01-29 MED ORDER — METRONIDAZOLE 500 MG PO TABS: 500.0000 mg | ORAL_TABLET | Freq: Two times a day (BID) | ORAL | 0 refills | Status: AC

## 2021-01-29 NOTE — Progress Notes (Signed)
Date:  01/29/2021   Name:  Deanna Potts   DOB:  07/26/1976   MRN:  [id number]   Chief Complaint: Back Pain (Patient wanted to be tested for UTI.Lower back pain started this weekend. Left flank area was sore. ) and Vaginal Discharge (Discharge started last week- clear. Vaginal Itching on and off. )  Vaginal Discharge The patient's primary symptoms include vaginal discharge. The patient's pertinent negatives include no pelvic pain. Associated symptoms include back pain. Pertinent negatives include no abdominal pain, chills, diarrhea, fever, frequency, headaches, hematuria, nausea or urgency.    Lab Results  Component Value Date   CREATININE 0.77 11/16/2019   BUN 9 11/16/2019   NA 141 11/16/2019   K 4.6 11/16/2019   CL 101 11/16/2019   CO2 25 11/16/2019   Lab Results  Component Value Date   CHOL 207 (H) 11/16/2019   HDL 41 11/16/2019   LDLCALC 141 (H) 11/16/2019   LDLDIRECT 134.7 08/26/2007   TRIG 140 11/16/2019   CHOLHDL 5.0 (H) 11/16/2019   Lab Results  Component Value Date   TSH 0.920 11/16/2019   Lab Results  Component Value Date   HGBA1C 5.7 (H) 11/16/2019   Lab Results  Component Value Date   WBC 6.5 11/16/2019   HGB 13.9 11/16/2019   HCT 42.5 11/16/2019   MCV 81 11/16/2019   PLT 363 11/16/2019   Lab Results  Component Value Date   ALT 34 (H) 11/16/2019   AST 19 11/16/2019   ALKPHOS 92 11/16/2019   BILITOT 0.5 11/16/2019     Review of Systems  Constitutional: Negative for chills, fatigue and fever.  Respiratory: Negative for chest tightness and shortness of breath.   Cardiovascular: Negative for chest pain.  Gastrointestinal: Negative for abdominal pain, diarrhea and nausea.       Left mid back discomfort resolved  Genitourinary: Positive for vaginal discharge. Negative for frequency, hematuria, pelvic pain and urgency.  Neurological: Negative for dizziness, light-headedness and headaches.    Patient Active Problem List   Diagnosis Date  Noted  . Encounter for screening colonoscopy   . Pre-diabetes 11/07/2018  . Mixed hyperlipidemia 11/05/2018  . Hematuria, undiagnosed cause 05/30/2016  . Environmental and seasonal allergies 02/08/2016  . Essential hypertension 02/08/2016  . Family history of colon cancer in mother 08/06/2015  . ALLERGIC RHINITIS 08/16/2007  . Dysmenorrhea 08/16/2007    Allergies  Allergen Reactions  . Ceftin [Cefuroxime] Diarrhea  . Betadine [Povidone Iodine] Itching  . Ciprofloxacin Hives  . Neomycin-Bacitracin Zn-Polymyx Itching         Past Surgical History:  Procedure Laterality Date  . CHOLECYSTECTOMY  2007  . COLONOSCOPY  07/2015  . COLONOSCOPY WITH PROPOFOL N/A 08/20/2020   Procedure: COLONOSCOPY WITH PROPOFOL;  Surgeon: Midge Minium, MD;  Location: [facility];  Service: Endoscopy;  Laterality: N/A;  priority 4  . GANGLION CYST EXCISION     Cyst    Social History   Tobacco Use  . Smoking status: Former Smoker    Packs/day: 1.00    Years: 12.00    Pack years: 12.00    Types: Cigarettes    Quit date: 07/18/2005    Years since quitting: 15.5  . Smokeless tobacco: Never Used  Vaping Use  . Vaping Use: Never used  Substance Use Topics  . Alcohol use: No    Alcohol/week: 0.0 standard drinks  . Drug use: No     Medication list has been reviewed and updated.  Current Meds  Medication Sig  . amLODipine (NORVASC) 10 MG tablet TAKE 1 TABLET DAILY  . Cholecalciferol (VITAMIN D3) 50 MCG (2000 UT) TABS Take by mouth daily.  Marland Kitchen loratadine (CLARITIN) 10 MG tablet Take 10 mg by mouth daily.  . Multiple Vitamin (MULTIVITAMIN) tablet Take 1 tablet by mouth daily.    PHQ 2/9 Scores 01/29/2021 11/16/2019 09/29/2019 04/15/2019  PHQ - 2 Score 0 0 0 0  PHQ- 9 Score 0 - - -    GAD 7 : Generalized Anxiety Score 01/29/2021  Nervous, Anxious, on Edge 0  Control/stop worrying 0  Worry too much - different things 0  Trouble relaxing 0  Restless 0  Easily annoyed or irritable 0   Afraid - awful might happen 0  Total GAD 7 Score 0  Anxiety Difficulty Not difficult at all    BP Readings from Last 3 Encounters:  01/29/21 (!) 144/82  08/27/20 118/84  08/20/20 120/78    Physical Exam Vitals and nursing note reviewed.  Constitutional:      General: She is not in acute distress.    Appearance: She is well-developed.  HENT:     Head: Normocephalic and atraumatic.  Cardiovascular:     Rate and Rhythm: Normal rate and regular rhythm.  Pulmonary:     Effort: Pulmonary effort is normal. No respiratory distress.     Breath sounds: No wheezing or rhonchi.  Abdominal:     Palpations: Abdomen is soft.     Tenderness: There is no abdominal tenderness. There is no right CVA tenderness or left CVA tenderness.  Skin:    General: Skin is warm and dry.     Findings: No rash.  Neurological:     Mental Status: She is alert and oriented to person, place, and time.  Psychiatric:        Mood and Affect: Mood normal.        Behavior: Behavior normal.     Wt Readings from Last 3 Encounters:  01/29/21 276 lb (125.2 kg)  08/27/20 256 lb (116.1 kg)  08/20/20 248 lb (112.5 kg)    BP (!) 144/82   Pulse 97   Ht 5\' 8"  (1.727 m)   Wt 276 lb (125.2 kg)   LMP 12/07/2020   SpO2 100%   BMI 41.97 kg/m   Assessment and Plan: 1. Acute vaginitis Incompletely treated with Diflucan Will treat more appropriately with Flagyl - POCT Wet Prep with KOH - POCT urinalysis dipstick - metroNIDAZOLE (FLAGYL) 500 MG tablet; Take 1 tablet (500 mg total) by mouth 2 (two) times daily for 7 days.  Dispense: 14 tablet; Refill: 0   Partially dictated using 12/09/2020. Any errors are unintentional.  Animal nutritionist, MD [facility]  01/29/2021

## 2021-02-21 DIAGNOSIS — Z1231 Encounter for screening mammogram for malignant neoplasm of breast: Secondary | ICD-10-CM | POA: Diagnosis not present

## 2021-02-21 DIAGNOSIS — Z6841 Body Mass Index (BMI) 40.0 and over, adult: Secondary | ICD-10-CM | POA: Diagnosis not present

## 2021-02-21 DIAGNOSIS — R6882 Decreased libido: Secondary | ICD-10-CM | POA: Diagnosis not present

## 2021-02-21 DIAGNOSIS — Z01419 Encounter for gynecological examination (general) (routine) without abnormal findings: Secondary | ICD-10-CM | POA: Diagnosis not present

## 2021-02-21 DIAGNOSIS — R5383 Other fatigue: Secondary | ICD-10-CM | POA: Diagnosis not present

## 2021-02-21 LAB — HM PAP SMEAR: HM Pap smear: NEGATIVE

## 2021-02-21 LAB — TESTOSTERONE: Testosterone: 33

## 2021-02-21 LAB — TSH: TSH: 0.93 (ref 0.41–5.90)

## 2021-03-10 ENCOUNTER — Encounter: Payer: Self-pay | Admitting: Internal Medicine

## 2021-03-26 ENCOUNTER — Encounter: Payer: Self-pay | Admitting: Internal Medicine

## 2021-03-27 ENCOUNTER — Other Ambulatory Visit: Payer: Self-pay

## 2021-03-27 ENCOUNTER — Encounter: Payer: Self-pay | Admitting: Internal Medicine

## 2021-03-27 ENCOUNTER — Ambulatory Visit (INDEPENDENT_AMBULATORY_CARE_PROVIDER_SITE_OTHER): Payer: BC Managed Care – PPO | Admitting: Internal Medicine

## 2021-03-27 VITALS — BP 136/82 | HR 91 | Temp 99.1°F | Ht 68.0 in | Wt 276.0 lb

## 2021-03-27 DIAGNOSIS — Z1159 Encounter for screening for other viral diseases: Secondary | ICD-10-CM

## 2021-03-27 DIAGNOSIS — I10 Essential (primary) hypertension: Secondary | ICD-10-CM | POA: Diagnosis not present

## 2021-03-27 DIAGNOSIS — R7303 Prediabetes: Secondary | ICD-10-CM

## 2021-03-27 DIAGNOSIS — E782 Mixed hyperlipidemia: Secondary | ICD-10-CM | POA: Diagnosis not present

## 2021-03-27 DIAGNOSIS — E559 Vitamin D deficiency, unspecified: Secondary | ICD-10-CM | POA: Diagnosis not present

## 2021-03-27 DIAGNOSIS — Z0000 Encounter for general adult medical examination without abnormal findings: Secondary | ICD-10-CM | POA: Diagnosis not present

## 2021-03-27 LAB — POCT URINALYSIS DIPSTICK
Bilirubin, UA: NEGATIVE
Glucose, UA: NEGATIVE
Ketones, UA: NEGATIVE
Leukocytes, UA: NEGATIVE
Nitrite, UA: NEGATIVE
Protein, UA: POSITIVE — AB
Spec Grav, UA: 1.015 (ref 1.010–1.025)
Urobilinogen, UA: 0.2 E.U./dL
pH, UA: 5 (ref 5.0–8.0)

## 2021-03-27 MED ORDER — AMLODIPINE BESYLATE 10 MG PO TABS: 1.0000 | ORAL_TABLET | Freq: Every day | ORAL | 1 refills | Status: DC

## 2021-03-27 NOTE — Progress Notes (Signed)
Date:  03/27/2021   Name:  Deanna Potts   DOB:  10-17-1976   MRN:  [id number]   Chief Complaint: Annual Exam (No breast or pap. See's [facility] in [place] for GYN. Had mammo this year with there office last month. It was NEG. )  Cloee Potts is a 45 y.o. female who presents today for her Complete Annual Exam. She feels well. She reports exercising - walking 5 days a week. She reports she is sleeping well. Breast complaints - none. She has sold her house and living in [place] in her fifth wheel for now.  Mammogram: 10/2019 ordered by GYN DEXA: none Pap smear: 02/2021 Colonoscopy: 08/2020  Immunization History  Administered Date(s) Administered  . Influenza Split 10/22/2011  . Influenza Whole 08/26/2010  . Influenza, Quadrivalent, Recombinant, Inj, Pf 09/14/2019  . Influenza,inj,Quad PF,6+ Mos 08/22/2015  . Influenza-Unspecified 08/21/2017, 08/20/2018, 07/28/2020  . PFIZER(Purple Top)SARS-COV-2 Vaccination 02/14/2020, 03/06/2020, 09/10/2020  . Td 06/05/2003  . Tdap 02/08/2016    Hypertension This is a chronic problem. The problem is controlled. Pertinent negatives include no chest pain, headaches, palpitations or shortness of breath. Past treatments include calcium channel blockers. The current treatment provides significant improvement. There are no compliance problems.     Lab Results  Component Value Date   CREATININE 0.77 11/16/2019   BUN 9 11/16/2019   NA 141 11/16/2019   K 4.6 11/16/2019   CL 101 11/16/2019   CO2 25 11/16/2019   Lab Results  Component Value Date   CHOL 207 (H) 11/16/2019   HDL 41 11/16/2019   LDLCALC 141 (H) 11/16/2019   LDLDIRECT 134.7 08/26/2007   TRIG 140 11/16/2019   CHOLHDL 5.0 (H) 11/16/2019   Lab Results  Component Value Date   TSH 0.920 11/16/2019   Lab Results  Component Value Date   HGBA1C 5.7 (H) 11/16/2019   Lab Results  Component Value Date   WBC 6.5 11/16/2019   HGB 13.9 11/16/2019   HCT 42.5  11/16/2019   MCV 81 11/16/2019   PLT 363 11/16/2019   Lab Results  Component Value Date   ALT 34 (H) 11/16/2019   AST 19 11/16/2019   ALKPHOS 92 11/16/2019   BILITOT 0.5 11/16/2019     Review of Systems  Constitutional: Negative for chills, fatigue and fever.  HENT: Negative for congestion, hearing loss, tinnitus, trouble swallowing and voice change.   Eyes: Negative for visual disturbance.  Respiratory: Negative for cough, chest tightness, shortness of breath and wheezing.   Cardiovascular: Negative for chest pain, palpitations and leg swelling.  Gastrointestinal: Negative for abdominal pain, constipation, diarrhea and vomiting.  Endocrine: Negative for polydipsia and polyuria.  Genitourinary: Negative for dysuria, frequency, genital sores, vaginal bleeding and vaginal discharge.  Musculoskeletal: Negative for arthralgias, gait problem and joint swelling.  Skin: Negative for color change and rash.  Neurological: Negative for dizziness, tremors, light-headedness and headaches.  Hematological: Negative for adenopathy. Does not bruise/bleed easily.  Psychiatric/Behavioral: Negative for dysphoric mood and sleep disturbance. The patient is not nervous/anxious.     Patient Active Problem List   Diagnosis Date Noted  . Vitamin D deficiency 03/27/2021  . Encounter for screening colonoscopy   . Pre-diabetes 11/07/2018  . Mixed hyperlipidemia 11/05/2018  . Hematuria, undiagnosed cause 05/30/2016  . Environmental and seasonal allergies 02/08/2016  . Essential hypertension 02/08/2016  . Family history of colon cancer in mother 08/06/2015  . ALLERGIC RHINITIS 08/16/2007  . Dysmenorrhea 08/16/2007    Allergies  Allergen Reactions  . Ceftin [Cefuroxime] Diarrhea  . Betadine [Povidone Iodine] Itching  . Ciprofloxacin Hives  . Neomycin-Bacitracin Zn-Polymyx Itching         Past Surgical History:  Procedure Laterality Date  . CHOLECYSTECTOMY  2007  . COLONOSCOPY  07/2015  .  COLONOSCOPY WITH PROPOFOL N/A 08/20/2020   Procedure: COLONOSCOPY WITH PROPOFOL;  Surgeon: Midge Minium, MD;  Location: [facility];  Service: Endoscopy;  Laterality: N/A;  priority 4  . GANGLION CYST EXCISION     Cyst    Social History   Tobacco Use  . Smoking status: Former Smoker    Packs/day: 1.00    Years: 12.00    Pack years: 12.00    Types: Cigarettes    Quit date: 07/18/2005    Years since quitting: 15.7  . Smokeless tobacco: Never Used  Vaping Use  . Vaping Use: Never used  Substance Use Topics  . Alcohol use: No    Alcohol/week: 0.0 standard drinks  . Drug use: No     Medication list has been reviewed and updated.  Current Meds  Medication Sig  . amLODipine (NORVASC) 10 MG tablet TAKE 1 TABLET DAILY  . Cholecalciferol (VITAMIN D3) 50 MCG (2000 UT) TABS Take by mouth daily.  Marland Kitchen loratadine (CLARITIN) 10 MG tablet Take 10 mg by mouth daily.  . Multiple Vitamin (MULTIVITAMIN) tablet Take 1 tablet by mouth daily.    PHQ 2/9 Scores 03/27/2021 01/29/2021 11/16/2019 09/29/2019  PHQ - 2 Score 0 0 0 0  PHQ- 9 Score 0 0 - -    GAD 7 : Generalized Anxiety Score 03/27/2021 01/29/2021  Nervous, Anxious, on Edge 0 0  Control/stop worrying 0 0  Worry too much - different things 0 0  Trouble relaxing 0 0  Restless 0 0  Easily annoyed or irritable 0 0  Afraid - awful might happen 0 0  Total GAD 7 Score 0 0  Anxiety Difficulty Not difficult at all Not difficult at all    BP Readings from Last 3 Encounters:  03/27/21 136/82  01/29/21 136/84  08/27/20 118/84    Physical Exam Vitals and nursing note reviewed.  Constitutional:      General: She is not in acute distress.    Appearance: She is well-developed.  HENT:     Head: Normocephalic and atraumatic.     Right Ear: Tympanic membrane and ear canal normal.     Left Ear: Tympanic membrane and ear canal normal.     Nose:     Right Sinus: No maxillary sinus tenderness.     Left Sinus: No maxillary sinus  tenderness.  Eyes:     General: No scleral icterus.       Right eye: No discharge.        Left eye: No discharge.     Conjunctiva/sclera: Conjunctivae normal.  Neck:     Thyroid: No thyromegaly.     Vascular: No carotid bruit.  Cardiovascular:     Rate and Rhythm: Normal rate and regular rhythm.     Pulses: Normal pulses.     Heart sounds: Normal heart sounds.  Pulmonary:     Effort: Pulmonary effort is normal. No respiratory distress.     Breath sounds: No wheezing.  Abdominal:     General: Bowel sounds are normal.     Palpations: Abdomen is soft.     Tenderness: There is no abdominal tenderness.  Musculoskeletal:     Cervical back: Normal range of motion.  No erythema.     Right lower leg: No edema.     Left lower leg: No edema.  Lymphadenopathy:     Cervical: No cervical adenopathy.  Skin:    General: Skin is warm and dry.     Findings: No rash.  Neurological:     Mental Status: She is alert and oriented to person, place, and time.     Cranial Nerves: No cranial nerve deficit.     Sensory: No sensory deficit.     Deep Tendon Reflexes: Reflexes are normal and symmetric.  Psychiatric:        Attention and Perception: Attention normal.        Mood and Affect: Mood normal.        Speech: Speech normal.     Wt Readings from Last 3 Encounters:  03/27/21 276 lb (125.2 kg)  01/29/21 276 lb (125.2 kg)  08/27/20 256 lb (116.1 kg)    BP 136/82   Pulse 91   Temp 99.1 F (37.3 C) (Oral)   Ht 5\' 8"  (1.727 m)   Wt 276 lb (125.2 kg)   SpO2 96%   BMI 41.97 kg/m   Assessment and Plan: 1. Annual physical exam Exam is normal except for weight. Encourage regular exercise and resume previous low carb diet for weight loss. Mammogram done recently by GYN - report requested. Other screenings and immunizations are up to date.  2. Need for hepatitis C screening test - Hepatitis C antibody  3. Essential hypertension Clinically stable exam with well controlled BP. Tolerating  medications without side effects at this time. Pt to continue current regimen and low sodium diet; benefits of regular exercise as able discussed. - CBC with Differential/Platelet - Comprehensive metabolic panel - POCT urinalysis dipstick - amLODipine (NORVASC) 10 MG tablet; Take 1 tablet (10 mg total) by mouth daily.  Dispense: 90 tablet; Refill: 1  4. Vitamin D deficiency supplemented - VITAMIN D 25 Hydroxy (Vit-D Deficiency, Fractures)  5. Mixed hyperlipidemia Check labs and advise. - Lipid panel  6. Pre-diabetes - Hemoglobin A1c   Partially dictated using . Any errors are unintentional.  Animal nutritionist, MD [facility]  03/27/2021

## 2021-03-28 LAB — CBC WITH DIFFERENTIAL/PLATELET
Basophils Absolute: 0.1 10*3/uL (ref 0.0–0.2)
Basos: 1 %
EOS (ABSOLUTE): 0.2 10*3/uL (ref 0.0–0.4)
Eos: 2 %
Hematocrit: 39.8 % (ref 34.0–46.6)
Hemoglobin: 13.3 g/dL (ref 11.1–15.9)
Immature Grans (Abs): 0 10*3/uL (ref 0.0–0.1)
Immature Granulocytes: 0 %
Lymphocytes Absolute: 2.3 10*3/uL (ref 0.7–3.1)
Lymphs: 26 %
MCH: 27.3 pg (ref 26.6–33.0)
MCHC: 33.4 g/dL (ref 31.5–35.7)
MCV: 82 fL (ref 79–97)
Monocytes Absolute: 0.5 10*3/uL (ref 0.1–0.9)
Monocytes: 5 %
Neutrophils Absolute: 5.8 10*3/uL (ref 1.4–7.0)
Neutrophils: 66 %
Platelets: 329 10*3/uL (ref 150–450)
RBC: 4.88 x10E6/uL (ref 3.77–5.28)
RDW: 14.2 % (ref 11.7–15.4)
WBC: 8.9 10*3/uL (ref 3.4–10.8)

## 2021-03-28 LAB — COMPREHENSIVE METABOLIC PANEL
ALT: 46 IU/L — ABNORMAL HIGH (ref 0–32)
AST: 19 IU/L (ref 0–40)
Albumin/Globulin Ratio: 1.4 (ref 1.2–2.2)
Albumin: 4.5 g/dL (ref 3.8–4.8)
Alkaline Phosphatase: 83 IU/L (ref 44–121)
BUN/Creatinine Ratio: 13 (ref 9–23)
BUN: 11 mg/dL (ref 6–24)
Bilirubin Total: 0.3 mg/dL (ref 0.0–1.2)
CO2: 23 mmol/L (ref 20–29)
Calcium: 10.2 mg/dL (ref 8.7–10.2)
Chloride: 100 mmol/L (ref 96–106)
Creatinine, Ser: 0.84 mg/dL (ref 0.57–1.00)
Globulin, Total: 3.2 g/dL (ref 1.5–4.5)
Glucose: 102 mg/dL — ABNORMAL HIGH (ref 65–99)
Potassium: 5 mmol/L (ref 3.5–5.2)
Sodium: 139 mmol/L (ref 134–144)
Total Protein: 7.7 g/dL (ref 6.0–8.5)
eGFR: 87 mL/min/{1.73_m2} (ref >59)

## 2021-03-28 LAB — LIPID PANEL
Chol/HDL Ratio: 3.9 ratio (ref 0.0–4.4)
Cholesterol, Total: 191 mg/dL (ref 100–199)
HDL: 49 mg/dL (ref >39)
LDL Chol Calc (NIH): 120 mg/dL — ABNORMAL HIGH (ref 0–99)
Triglycerides: 122 mg/dL (ref 0–149)
VLDL Cholesterol Cal: 22 mg/dL (ref 5–40)

## 2021-03-28 LAB — HEPATITIS C ANTIBODY: Hep C Virus Ab: <0.1 s/co ratio (ref 0.0–0.9)

## 2021-03-28 LAB — VITAMIN D 25 HYDROXY (VIT D DEFICIENCY, FRACTURES): Vit D, 25-Hydroxy: 29.5 ng/mL — ABNORMAL LOW (ref 30.0–100.0)

## 2021-03-28 LAB — HEMOGLOBIN A1C
Est. average glucose Bld gHb Est-mCnc: 131 mg/dL
Hgb A1c MFr Bld: 6.2 % — ABNORMAL HIGH (ref 4.8–5.6)

## 2021-05-19 ENCOUNTER — Telehealth: Payer: BC Managed Care – PPO | Admitting: Physician Assistant

## 2021-05-19 ENCOUNTER — Encounter: Payer: Self-pay | Admitting: Physician Assistant

## 2021-05-19 DIAGNOSIS — R0981 Nasal congestion: Secondary | ICD-10-CM | POA: Diagnosis not present

## 2021-05-19 DIAGNOSIS — R059 Cough, unspecified: Secondary | ICD-10-CM | POA: Diagnosis not present

## 2021-05-19 DIAGNOSIS — U071 COVID-19: Secondary | ICD-10-CM | POA: Diagnosis not present

## 2021-05-19 MED ORDER — MOLNUPIRAVIR EUA 200MG CAPSULE
4.0000 | ORAL_CAPSULE | Freq: Two times a day (BID) | ORAL | 0 refills | Status: AC
  Filled 2021-05-19: qty 40, 5d supply, fill #0

## 2021-05-19 MED ORDER — FLUTICASONE PROPIONATE 50 MCG/ACT NA SUSP
2.0000 | Freq: Every day | NASAL | 6 refills | Status: DC
  Filled 2021-05-19: qty 16, 30d supply, fill #0

## 2021-05-19 MED ORDER — BENZONATATE 100 MG PO CAPS
100.0000 mg | ORAL_CAPSULE | Freq: Two times a day (BID) | ORAL | 0 refills | Status: DC | PRN
  Filled 2021-05-19: qty 20, 10d supply, fill #0

## 2021-05-19 NOTE — Progress Notes (Signed)
Virtual Visit via Video Note  I connected with Deanna Potts on 05/19/21 at 10:00 AM EDT by a video enabled telemedicine application and verified that I am speaking with the correct person using two identifiers.  Location: Patient: patient's home  Provider: provider's office  Person participating in the virtual visit: patient and provider   I discussed the limitations of evaluation and management by telemedicine and the availability of in person appointments. The patient expressed understanding and agreed to proceed.      I discussed the assessment and treatment plan with the patient. The patient was provided an opportunity to ask questions and all were answered. The patient agreed with the plan and demonstrated an understanding of the instructions.   The patient was advised to call back or seek an in-person evaluation if the symptoms worsen or if the condition fails to improve as anticipated.  I provided 20 minutes of non-face-to-face time during this encounter.   Waldon Merl, PA-C   Acute Office Visit  Subjective:    Patient ID: Deanna Potts, female    DOB: 1976-08-12, 45 y.o.   MRN: [id number]  No chief complaint on file.   45 yo F in NAD, A and O x 3,  with PMH of obesity, preDM, connects via video visit inquiring about Covid 19 antiviral medication. States she tested pos for Covid 19 2 days ago. Has had symptoms of nasal congestion, post nasal drainage x 4 days, and developed cough today. States she exposed to someone with Covid 19 in the past week. The cough is dry and is in spells. Has not used any meds. Denies any hx of CKD, HLD, and is not on statin.   Other Associated symptoms include congestion, coughing and weakness. Pertinent negatives include no abdominal pain, chest pain, chills, diaphoresis, fatigue, fever, headaches, joint swelling, myalgias, nausea, neck pain, rash, sore throat, visual change or vomiting.  Patient is in today for pos covid, wants  medicine  Past Medical History:  Diagnosis Date   Abnormal liver function    Allergic rhinitis    Anemia, iron deficiency    Cholelithiasis    Conjunctivitis    Dysmenorrhea    Ganglion cyst    Hemorrhoids    HTN (hypertension)    Hyperlipidemia    LIVER FUNCTION TESTS, ABNORMAL 08/16/2007   Resolved after cholecystectomy    Miscarriage 2008   Obesity    Pre-diabetes     Past Surgical History:  Procedure Laterality Date   CHOLECYSTECTOMY  2007   COLONOSCOPY  07/2015   COLONOSCOPY WITH PROPOFOL N/A 08/20/2020   Procedure: COLONOSCOPY WITH PROPOFOL;  Surgeon: Lucilla Lame, MD;  Location: [place];  Service: Endoscopy;  Laterality: N/A;  priority 4   GANGLION CYST EXCISION     Cyst    Family History  Problem Relation Age of Onset   Colon cancer Mother        died at 75   Diabetes Father    Heart disease Father    Aneurysm Father    Colon polyps Maternal Grandmother        benign but still had colectomy   Heart disease Paternal Grandfather    Kidney disease Neg Hx    Bladder Cancer Neg Hx    Breast cancer Neg Hx     Social History   Socioeconomic History   Marital status: Married    Spouse name: Not on file   Number of children: 2   Years of education:  Not on file   Highest education level: Not on file  Occupational History   Occupation: Student  Tobacco Use   Smoking status: Former    Packs/day: 1.00    Years: 12.00    Pack years: 12.00    Types: Cigarettes    Quit date: 07/18/2005    Years since quitting: 15.8   Smokeless tobacco: Never  Vaping Use   Vaping Use: Never used  Substance and Sexual Activity   Alcohol use: No    Alcohol/week: 0.0 standard drinks   Drug use: No   Sexual activity: Not on file  Other Topics Concern   Not on file  Social History Narrative   Married, 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 90 tablet 1   Cholecalciferol (VITAMIN D3) 50 MCG (2000 UT) TABS Take by mouth daily.     loratadine (CLARITIN) 10 MG tablet Take 10 mg by mouth daily.     Multiple Vitamin (MULTIVITAMIN) tablet Take 1 tablet by mouth daily.     No facility-administered medications prior to visit.    Allergies  Allergen Reactions   Ceftin [Cefuroxime] Diarrhea   Betadine [Povidone Iodine] Itching   Ciprofloxacin Hives   Neomycin-Bacitracin Zn-Polymyx Itching         Review of Systems  Constitutional:  Negative for activity change, appetite change, chills, diaphoresis, fatigue and fever.  HENT:  Positive for congestion and postnasal drip. Negative for rhinorrhea, sinus pressure, sinus pain, sneezing, sore throat and trouble swallowing.   Respiratory:  Positive for cough. Negative for chest tightness, shortness of breath and wheezing.   Cardiovascular:  Negative for chest pain.  Gastrointestinal:  Negative for abdominal pain, nausea and vomiting.  Musculoskeletal:  Negative for joint swelling, myalgias and neck pain.  Skin:  Negative for rash.  Neurological:  Positive for weakness. Negative for headaches.  Psychiatric/Behavioral:  Negative for agitation, behavioral problems and confusion.       Objective:    Physical Exam Constitutional:      General: She is not in acute distress.    Appearance: Normal appearance. She is obese. She is not ill-appearing, toxic-appearing or diaphoretic.  HENT:     Head: Normocephalic and atraumatic.  Eyes:     General: No scleral icterus. Pulmonary:     Effort:  Pulmonary effort is normal.  Skin:    Coloration: Skin is not pale.  Neurological:     Mental Status: She is alert and oriented to person, place, and time.  Psychiatric:        Mood and Affect: Mood normal.        Behavior: Behavior normal.        Thought Content: Thought content normal.        Judgment: Judgment normal.    There were no vitals taken for this visit. Wt Readings from Last 3 Encounters:  03/27/21 276 lb (125.2 kg)  01/29/21 276 lb (125.2  kg)  08/27/20 256 lb (116.1 kg)    There are no preventive care reminders to display for this patient.  There are no preventive care reminders to display for this patient.   Lab Results  Component Value Date   TSH 0.93 02/21/2021   Lab Results  Component Value Date   WBC 8.9 03/27/2021   HGB 13.3 03/27/2021   HCT 39.8 03/27/2021   MCV 82 03/27/2021   PLT 329 03/27/2021   Lab Results  Component Value Date   NA 139 03/27/2021   K 5.0 03/27/2021   CO2 23 03/27/2021   GLUCOSE 102 (H) 03/27/2021   BUN 11 03/27/2021   CREATININE 0.84 03/27/2021   BILITOT 0.3 03/27/2021   ALKPHOS 83 03/27/2021   AST 19 03/27/2021   ALT 46 (H) 03/27/2021   PROT 7.7 03/27/2021   ALBUMIN 4.5 03/27/2021   CALCIUM 10.2 03/27/2021   EGFR 87 03/27/2021   Lab Results  Component Value Date   CHOL 191 03/27/2021   Lab Results  Component Value Date   HDL 49 03/27/2021   Lab Results  Component Value Date   LDLCALC 120 (H) 03/27/2021   Lab Results  Component Value Date   TRIG 122 03/27/2021   Lab Results  Component Value Date   CHOLHDL 3.9 03/27/2021   Lab Results  Component Value Date   HGBA1C 6.2 (H) 03/27/2021       Assessment & Plan:   Problem List Items Addressed This Visit   None Visit Diagnoses     COVID    -  Primary   Relevant Medications   molnupiravir EUA 200 mg CAPS   Cough       Nasal congestion            Meds ordered this encounter  Medications   benzonatate (TESSALON) 100 MG capsule    Sig:  Take 1 capsule (100 mg total) by mouth 2 (two) times daily as needed for cough.    Dispense:  20 capsule    Refill:  0    Order Specific Question:   Supervising Provider    Answer:   Waldon Merl [[id number]]   fluticasone (FLONASE) 50 MCG/ACT nasal spray    Sig: Place 2 sprays into both nostrils daily.    Dispense:  16 g    Refill:  6    Order Specific Question:   Supervising Provider    Answer:   Waldon Merl [[id number]]   molnupiravir EUA 200 mg CAPS    Sig: Take 4 capsules (800 mg total) by mouth 2 (two) times daily for 5 days.    Dispense:  40 capsule    Refill:  0    Order Specific Question:   Supervising Provider    Answer:   Waldon Merl [phone]     Confirmed pos covid. Pt sxs mild, discussed no treatment, but pt wanted med prescribed. Send med to [place]. Advised pt if any worsening, to go to the [facility], PA-C

## 2021-05-21 ENCOUNTER — Other Ambulatory Visit: Payer: Self-pay

## 2021-06-03 ENCOUNTER — Encounter: Payer: Self-pay | Admitting: Internal Medicine

## 2021-06-06 ENCOUNTER — Encounter: Payer: Self-pay | Admitting: Internal Medicine

## 2021-06-07 ENCOUNTER — Other Ambulatory Visit: Payer: Self-pay | Admitting: Internal Medicine

## 2021-06-07 DIAGNOSIS — R059 Cough, unspecified: Secondary | ICD-10-CM

## 2021-06-07 MED ORDER — BENZONATATE 100 MG PO CAPS: 100.0000 mg | ORAL_CAPSULE | Freq: Two times a day (BID) | ORAL | 0 refills | Status: DC | PRN

## 2021-09-23 ENCOUNTER — Other Ambulatory Visit: Payer: Self-pay | Admitting: Internal Medicine

## 2021-09-23 DIAGNOSIS — I10 Essential (primary) hypertension: Secondary | ICD-10-CM

## 2021-09-23 NOTE — Telephone Encounter (Signed)
Requested Prescriptions  Pending Prescriptions Disp Refills  . amLODipine (NORVASC) 10 MG tablet [Pharmacy Med Name: AMLODIPINE BESYLATE TABS 10MG ] 90 tablet 1    Sig: TAKE 1 TABLET DAILY     Cardiovascular:  Calcium Channel Blockers Passed - 09/23/2021  8:49 AM      Passed - Last BP in normal range    BP Readings from Last 1 Encounters:  03/27/21 136/82         Passed - Valid encounter within last 6 months    Recent Outpatient Visits          6 months ago Annual physical exam   [facility], [facility]   7 months ago Acute vaginitis   [facility], MD   1 year ago Acute cystitis with hematuria   [facility], MD   1 year ago Annual physical exam   [facility], MD   1 year ago Yeast UTI   [facility], MD      Future Appointments            In 6 months Reubin Milan Judithann Graves, MD [facility], [facility]

## 2021-10-01 ENCOUNTER — Ambulatory Visit: Payer: BC Managed Care – PPO | Admitting: Internal Medicine

## 2021-10-30 ENCOUNTER — Ambulatory Visit: Payer: Self-pay

## 2021-10-30 ENCOUNTER — Encounter: Payer: Self-pay | Admitting: Internal Medicine

## 2021-10-30 ENCOUNTER — Ambulatory Visit: Payer: BC Managed Care – PPO | Admitting: Internal Medicine

## 2021-10-30 ENCOUNTER — Other Ambulatory Visit: Payer: Self-pay

## 2021-10-30 VITALS — BP 136/84 | HR 97 | Ht 68.0 in | Wt 286.0 lb

## 2021-10-30 DIAGNOSIS — R310 Gross hematuria: Secondary | ICD-10-CM

## 2021-10-30 LAB — POC URINALYSIS WITH MICROSCOPIC (NON AUTO)MANUAL RESULT
Bilirubin, UA: NEGATIVE
Glucose, UA: NEGATIVE
Ketones, UA: NEGATIVE
Leukocytes, UA: NEGATIVE
Nitrite, UA: NEGATIVE
Protein, UA: NEGATIVE
Spec Grav, UA: 1.015 (ref 1.010–1.025)
Urobilinogen, UA: 0.2 E.U./dL
pH, UA: 5 (ref 5.0–8.0)

## 2021-10-30 MED ORDER — SULFAMETHOXAZOLE-TRIMETHOPRIM 800-160 MG PO TABS: 1.0000 | ORAL_TABLET | Freq: Two times a day (BID) | ORAL | 0 refills | Status: DC

## 2021-10-30 NOTE — Telephone Encounter (Signed)
°  Chief Complaint: Red urine, low back pain Symptoms: See above Frequency: Started yesterday Pertinent Negatives: Patient denies Fever Disposition: [] ED /[] Urgent Care (no appt availability in office) / [x] Appointment(In office/virtual)/ []  [place] [facility]/ [] Home Care/ [] Refused Recommended Disposition  Additional Notes: made appointment for today.   Reason for Disposition  Side (flank) or lower back pain present  Answer Assessment - Initial Assessment Questions 1. SYMPTOM: "What's the main symptom you're concerned about?" (e.g., frequency, incontinence)     Red urine, pain 2. ONSET: "When did the  symptoms start?"     Yesterday 3. PAIN: "Is there any pain?" If Yes, ask: "How bad is it?" (Scale: 1-10; mild, moderate, severe)     Back pain - moderate 4. CAUSE: "What do you think is causing the symptoms?"     UTI 5. OTHER SYMPTOMS: "Do you have any other symptoms?" (e.g., fever, flank pain, blood in urine, pain with urination)     Bladder pain 6. PREGNANCY: "Is there any chance you are pregnant?" "When was your last menstrual period?"     No  Protocols used: Urinary Symptoms-A-AH

## 2021-10-30 NOTE — Progress Notes (Signed)
Date:  10/30/2021   Name:  Deanna Potts   DOB:  1976-02-20   MRN:  [id number]   Chief Complaint: Urinary Tract Infection (Flank pain, red colored urine, lower back pain )  Flank Pain This is a new problem. The current episode started in the past 7 days. The problem occurs constantly. The problem has been waxing and waning since onset. The quality of the pain is described as shooting and stabbing. The pain is The same all the time. Associated symptoms include dysuria and pelvic pain. Pertinent negatives include no fever or numbness. Patient noticed blood in urine which is Positive in the Urinalysis as well.  Lab Results  Component Value Date   NA 139 03/27/2021   K 5.0 03/27/2021   CO2 23 03/27/2021   GLUCOSE 102 (H) 03/27/2021   BUN 11 03/27/2021   CREATININE 0.84 03/27/2021   CALCIUM 10.2 03/27/2021   EGFR 87 03/27/2021   GFRNONAA 94 11/16/2019   Lab Results  Component Value Date   CHOL 191 03/27/2021   HDL 49 03/27/2021   LDLCALC 120 (H) 03/27/2021   LDLDIRECT 134.7 08/26/2007   TRIG 122 03/27/2021   CHOLHDL 3.9 03/27/2021   Lab Results  Component Value Date   TSH 0.93 02/21/2021   Lab Results  Component Value Date   HGBA1C 6.2 (H) 03/27/2021   Lab Results  Component Value Date   WBC 8.9 03/27/2021   HGB 13.3 03/27/2021   HCT 39.8 03/27/2021   MCV 82 03/27/2021   PLT 329 03/27/2021   Lab Results  Component Value Date   ALT 46 (H) 03/27/2021   AST 19 03/27/2021   ALKPHOS 83 03/27/2021   BILITOT 0.3 03/27/2021   Lab Results  Component Value Date   VD25OH 29.5 (L) 03/27/2021     Review of Systems  Constitutional:  Negative for fever.  Genitourinary:  Positive for dysuria, flank pain and pelvic pain.  Neurological:  Negative for numbness.   Patient Active Problem List   Diagnosis Date Noted   Vitamin D deficiency 03/27/2021   Encounter for screening colonoscopy    Pre-diabetes 11/07/2018   Mixed hyperlipidemia 11/05/2018   Hematuria,  undiagnosed cause 05/30/2016   Environmental and seasonal allergies 02/08/2016   Essential hypertension 02/08/2016   Family history of colon cancer in mother 08/06/2015   ALLERGIC RHINITIS 08/16/2007   Dysmenorrhea 08/16/2007    Allergies  Allergen Reactions   Ceftin [Cefuroxime] Diarrhea   Betadine [Povidone Iodine] Itching   Ciprofloxacin Hives   Neomycin-Bacitracin Zn-Polymyx Itching         Past Surgical History:  Procedure Laterality Date   CHOLECYSTECTOMY  2007   COLONOSCOPY  07/2015   COLONOSCOPY WITH PROPOFOL N/A 08/20/2020   Procedure: COLONOSCOPY WITH PROPOFOL;  Surgeon: Lucilla Lame, MD;  Location: [facility];  Service: Endoscopy;  Laterality: N/A;  priority 4   GANGLION CYST EXCISION     Cyst    Social History   Tobacco Use   Smoking status: Former    Packs/day: 1.00    Years: 12.00    Pack years: 12.00    Types: Cigarettes    Quit date: 07/18/2005    Years since quitting: 16.2   Smokeless tobacco: Never  Vaping Use   Vaping Use: Never used  Substance Use Topics   Alcohol use: No    Alcohol/week: 0.0 standard drinks   Drug use: No     Medication list has been reviewed and updated.  Current  Meds  Medication Sig   amLODipine (NORVASC) 10 MG tablet TAKE 1 TABLET DAILY   Cholecalciferol (VITAMIN D3) 50 MCG (2000 UT) TABS Take by mouth daily.   loratadine (CLARITIN) 10 MG tablet Take 10 mg by mouth daily.   Multiple Vitamin (MULTIVITAMIN) tablet Take 1 tablet by mouth daily.   sulfamethoxazole-trimethoprim (BACTRIM DS) 800-160 MG tablet Take 1 tablet by mouth 2 (two) times daily for 7 days.   [DISCONTINUED] benzonatate (TESSALON) 100 MG capsule Take 1 capsule (100 mg total) by mouth 2 (two) times daily as needed for cough.    PHQ 2/9 Scores 10/30/2021 03/27/2021 01/29/2021 11/16/2019  PHQ - 2 Score 0 0 0 0  PHQ- 9 Score 0 0 0 -    GAD 7 : Generalized Anxiety Score 10/30/2021 03/27/2021 01/29/2021  Nervous, Anxious, on Edge 0 0 0  Control/stop  worrying 0 0 0  Worry too much - different things 0 0 0  Trouble relaxing 0 0 0  Restless 0 0 0  Easily annoyed or irritable 0 0 0  Afraid - awful might happen 0 0 0  Total GAD 7 Score 0 0 0  Anxiety Difficulty Not difficult at all Not difficult at all Not difficult at all    BP Readings from Last 3 Encounters:  10/30/21 136/84  03/27/21 136/82  01/29/21 136/84    Physical Exam Vitals and nursing note reviewed.  Constitutional:      Appearance: Normal appearance. She is well-developed.  Cardiovascular:     Rate and Rhythm: Normal rate and regular rhythm.     Heart sounds: Normal heart sounds.  Pulmonary:     Effort: Pulmonary effort is normal. No respiratory distress.     Breath sounds: Normal breath sounds.  Abdominal:     General: Bowel sounds are normal.     Palpations: Abdomen is soft.     Tenderness: There is abdominal tenderness in the suprapubic area. There is no right CVA tenderness, left CVA tenderness, guarding or rebound.  Neurological:     Mental Status: She is alert.    Wt Readings from Last 3 Encounters:  10/30/21 286 lb (129.7 kg)  03/27/21 276 lb (125.2 kg)  01/29/21 276 lb (125.2 kg)    BP 136/84 (BP Location: Right Arm, Cuff Size: Large)    Pulse 97    Ht '5\' 8"'  (1.727 m)    Wt 286 lb (129.7 kg)    SpO2 98%    BMI 43.49 kg/m   Assessment and Plan: 1. Gross hematuria With evidence of UTI - will treat presumptively. Continue to push fluids. Follow up if needed. - POC urinalysis w microscopic (non auto) - sulfamethoxazole-trimethoprim (BACTRIM DS) 800-160 MG tablet; Take 1 tablet by mouth 2 (two) times daily for 7 days.  Dispense: 14 tablet; Refill: 0   Partially dictated using Editor, commissioning. Any errors are unintentional.  Halina Maidens, MD [facility]  10/30/2021

## 2021-10-31 ENCOUNTER — Other Ambulatory Visit: Payer: Self-pay | Admitting: Internal Medicine

## 2021-10-31 ENCOUNTER — Ambulatory Visit: Payer: Self-pay | Admitting: *Deleted

## 2021-10-31 DIAGNOSIS — N3001 Acute cystitis with hematuria: Secondary | ICD-10-CM

## 2021-10-31 MED ORDER — NITROFURANTOIN MONOHYD MACRO 100 MG PO CAPS: 100.0000 mg | ORAL_CAPSULE | Freq: Two times a day (BID) | ORAL | 0 refills | Status: AC

## 2021-10-31 NOTE — Telephone Encounter (Signed)
°  Chief Complaint: Itching Symptoms: Same Frequency: Started yesterday an last night. Pertinent Negatives: Patient denies Rash Disposition: [] ED /[] Urgent Care (no appt availability in office) / [] Appointment(In office/virtual)/ []  [facility]/ [] Home Care/ [] Refused Recommended Disposition  Additional Notes: Pt. States after second dose of Bactrim she started having itching to chest, arms and legs. No rash. Requesting a different antibiotic. Please advise pt.     Answer Assessment - Initial Assessment Questions 1. NAME of MEDICATION: "What medicine are you calling about?"     Bactrim 2. QUESTION: "What is your question?" (e.g., double dose of medicine, side effect)     Caused itching 3. PRESCRIBING HCP: "Who prescribed it?" Reason: if prescribed by specialist, call should be referred to that group.     Dr. 4. SYMPTOMS: "Do you have any symptoms?"     Moderate 5. SEVERITY: If symptoms are present, ask "Are they mild, moderate or severe?"     Moderate 6. PREGNANCY:  "Is there any chance that you are pregnant?" "When was your last menstrual period?"     No  Protocols used: Medication Question Call-A-AH

## 2021-10-31 NOTE — Telephone Encounter (Signed)
Please review.  KP

## 2021-10-31 NOTE — Telephone Encounter (Signed)
Informed pt to stop taking bactrim and start taking Macrobid. Pt verbalized understating.  KP

## 2021-10-31 NOTE — Telephone Encounter (Signed)
Pt seen yesterday for a UTI.  Prescribed Bactrim.  Pt would like to have this changed to something else.  She said last night she started to itch under her arms, chest area, and under her knees.  It has since subsided since she has not taken anymore Bactrim.   Called patient to review symptoms of medication reaction of itching. No answer, left message on voicemail to call clinic back #[phone].

## 2021-11-20 ENCOUNTER — Other Ambulatory Visit: Payer: Self-pay

## 2021-11-20 DIAGNOSIS — I10 Essential (primary) hypertension: Secondary | ICD-10-CM

## 2021-11-20 MED ORDER — AMLODIPINE BESYLATE 10 MG PO TABS: 10.0000 mg | ORAL_TABLET | Freq: Every day | ORAL | 0 refills | Status: DC

## 2021-11-27 ENCOUNTER — Encounter: Payer: Self-pay | Admitting: Internal Medicine

## 2021-11-28 ENCOUNTER — Ambulatory Visit: Payer: BC Managed Care – PPO | Admitting: Internal Medicine

## 2021-11-28 ENCOUNTER — Encounter: Payer: Self-pay | Admitting: Internal Medicine

## 2021-11-28 ENCOUNTER — Other Ambulatory Visit: Payer: Self-pay

## 2021-11-28 VITALS — BP 132/85 | HR 109 | Ht 68.0 in | Wt 286.0 lb

## 2021-11-28 DIAGNOSIS — N200 Calculus of kidney: Secondary | ICD-10-CM | POA: Diagnosis not present

## 2021-11-28 DIAGNOSIS — R3129 Other microscopic hematuria: Secondary | ICD-10-CM | POA: Diagnosis not present

## 2021-11-28 LAB — POCT URINALYSIS DIPSTICK
Bilirubin, UA: NEGATIVE
Glucose, UA: NEGATIVE
Ketones, UA: NEGATIVE
Nitrite, UA: NEGATIVE
Protein, UA: NEGATIVE
Spec Grav, UA: 1.01 (ref 1.010–1.025)
Urobilinogen, UA: 0.2 E.U./dL
pH, UA: 7 (ref 5.0–8.0)

## 2021-11-28 MED ORDER — TAMSULOSIN HCL 0.4 MG PO CAPS: 0.4000 mg | ORAL_CAPSULE | Freq: Every day | ORAL | 0 refills | Status: DC

## 2021-11-28 NOTE — Progress Notes (Signed)
Date:  11/28/2021   Name:  Deanna Potts   DOB:  05/26/1976   MRN:  [id number]   Chief Complaint: Urinary Tract Infection  Hematuria This is a new (completed course of Macrobid.  Past 2 days having colicky left flank pain) problem. The current episode started more than 1 month ago. She describes the hematuria as microscopic hematuria. Hematuria confirmed by urinalysis. The pain is moderate. She describes her urine color as yellow. Irritative symptoms include frequency. Associated symptoms include flank pain. Pertinent negatives include no chills, fever, nausea or vomiting.   Lab Results  Component Value Date   NA 139 03/27/2021   K 5.0 03/27/2021   CO2 23 03/27/2021   GLUCOSE 102 (H) 03/27/2021   BUN 11 03/27/2021   CREATININE 0.84 03/27/2021   CALCIUM 10.2 03/27/2021   EGFR 87 03/27/2021   GFRNONAA 94 11/16/2019   Lab Results  Component Value Date   CHOL 191 03/27/2021   HDL 49 03/27/2021   LDLCALC 120 (H) 03/27/2021   LDLDIRECT 134.7 08/26/2007   TRIG 122 03/27/2021   CHOLHDL 3.9 03/27/2021   Lab Results  Component Value Date   TSH 0.93 02/21/2021   Lab Results  Component Value Date   HGBA1C 6.2 (H) 03/27/2021   Lab Results  Component Value Date   WBC 8.9 03/27/2021   HGB 13.3 03/27/2021   HCT 39.8 03/27/2021   MCV 82 03/27/2021   PLT 329 03/27/2021   Lab Results  Component Value Date   ALT 46 (H) 03/27/2021   AST 19 03/27/2021   ALKPHOS 83 03/27/2021   BILITOT 0.3 03/27/2021   Lab Results  Component Value Date   VD25OH 29.5 (L) 03/27/2021     Review of Systems  Constitutional:  Positive for diaphoresis. Negative for chills, fatigue and fever.  Respiratory:  Negative for chest tightness and shortness of breath.   Cardiovascular:  Negative for chest pain.  Gastrointestinal:  Negative for diarrhea, nausea and vomiting.  Genitourinary:  Positive for flank pain and frequency. Negative for hematuria.   Patient Active Problem List   Diagnosis  Date Noted   Vitamin D deficiency 03/27/2021   Encounter for screening colonoscopy    Pre-diabetes 11/07/2018   Mixed hyperlipidemia 11/05/2018   Hematuria, undiagnosed cause 05/30/2016   Environmental and seasonal allergies 02/08/2016   Essential hypertension 02/08/2016   Family history of colon cancer in mother 08/06/2015   ALLERGIC RHINITIS 08/16/2007   Dysmenorrhea 08/16/2007    Allergies  Allergen Reactions   Ceftin [Cefuroxime] Diarrhea   Bactrim [Sulfamethoxazole-Trimethoprim] Itching   Betadine [Povidone Iodine] Itching   Ciprofloxacin Hives   Neomycin-Bacitracin Zn-Polymyx Itching         Past Surgical History:  Procedure Laterality Date   CHOLECYSTECTOMY  2007   COLONOSCOPY  07/2015   COLONOSCOPY WITH PROPOFOL N/A 08/20/2020   Procedure: COLONOSCOPY WITH PROPOFOL;  Surgeon: Lucilla Lame, MD;  Location: [facility];  Service: Endoscopy;  Laterality: N/A;  priority 4   GANGLION CYST EXCISION     Cyst    Social History   Tobacco Use   Smoking status: Former    Packs/day: 1.00    Years: 12.00    Pack years: 12.00    Types: Cigarettes    Quit date: 07/18/2005    Years since quitting: 16.3   Smokeless tobacco: Never  Vaping Use   Vaping Use: Never used  Substance Use Topics   Alcohol use: No    Alcohol/week: 0.0 standard  drinks   Drug use: No     Medication list has been reviewed and updated.  Current Meds  Medication Sig   amLODipine (NORVASC) 10 MG tablet Take 1 tablet (10 mg total) by mouth daily.   Cholecalciferol (VITAMIN D3) 50 MCG (2000 UT) TABS Take by mouth daily.   loratadine (CLARITIN) 10 MG tablet Take 10 mg by mouth daily.   Multiple Vitamin (MULTIVITAMIN) tablet Take 1 tablet by mouth daily.   tamsulosin (FLOMAX) 0.4 MG CAPS capsule Take 1 capsule (0.4 mg total) by mouth at bedtime.    PHQ 2/9 Scores 11/28/2021 11/28/2021 10/30/2021 03/27/2021  PHQ - 2 Score 0 0 0 0  PHQ- 9 Score 0 0 0 0    GAD 7 : Generalized Anxiety Score  11/28/2021 11/28/2021 10/30/2021 03/27/2021  Nervous, Anxious, on Edge 0 0 0 0  Control/stop worrying 0 0 0 0  Worry too much - different things 0 0 0 0  Trouble relaxing 0 0 0 0  Restless 0 0 0 0  Easily annoyed or irritable 0 0 0 0  Afraid - awful might happen 0 0 0 0  Total GAD 7 Score 0 0 0 0  Anxiety Difficulty Not difficult at all Not difficult at all Not difficult at all Not difficult at all    BP Readings from Last 3 Encounters:  11/28/21 132/85  10/30/21 136/84  03/27/21 136/82    Physical Exam Vitals and nursing note reviewed.  Constitutional:      General: She is not in acute distress.    Appearance: She is well-developed. She is not ill-appearing.  HENT:     Head: Normocephalic and atraumatic.  Cardiovascular:     Rate and Rhythm: Normal rate and regular rhythm.  Pulmonary:     Effort: Pulmonary effort is normal. No respiratory distress.     Breath sounds: No wheezing or rhonchi.  Abdominal:     Tenderness: There is no abdominal tenderness. There is no right CVA tenderness or left CVA tenderness.  Skin:    General: Skin is warm and dry.     Findings: No rash.  Neurological:     Mental Status: She is alert and oriented to person, place, and time.  Psychiatric:        Mood and Affect: Mood normal.        Behavior: Behavior normal.   Urine dipstick shows positive for RBC's.  Micro exam: not done.   Wt Readings from Last 3 Encounters:  11/28/21 286 lb (129.7 kg)  10/30/21 286 lb (129.7 kg)  03/27/21 276 lb (125.2 kg)    BP 132/85 (BP Location: Right Arm, Cuff Size: Large)    Pulse (!) 109    Ht _0  (1.727 m)    Wt 286 lb (129.7 kg)    SpO2 98%    BMI 43.49 kg/m   Assessment and Plan: 1. Left renal stone Suspect this is the cause of her sx and hematuria. It was very small previously and hopefully can be passed spontaneously. Increase fluids and add flomax daily. If pain becomes severe, can go to ED for imaging. - tamsulosin (FLOMAX) 0.4 MG CAPS  capsule; Take 1 capsule (0.4 mg total) by mouth at bedtime.  Dispense: 20 capsule; Refill: 0  2. Other microscopic hematuria No indication for additional antibiotics - POCT Urinalysis Dipstick   Partially dictated using Dragon software. Any errors are unintentional.  Halina Maidens, MD [facility]  11/28/2021

## 2021-11-28 NOTE — Progress Notes (Signed)
Date:  11/28/2021   Name:  Deanna Potts   DOB:  24-Jun-1976   MRN:  [id number]   Chief Complaint: No chief complaint on file.  Urinary Tract Infection  This is a new problem. The current episode started 1 to 4 weeks ago. Associated symptoms include hematuria. Patient came in today to follow up on Hematuria from 10/30/2021. Patient was given Bactrim which she had side effects from, so she was then given Macrobid. Patient is having Left Flank pain now, but no other symptoms.  Lab Results  Component Value Date   NA 139 03/27/2021   K 5.0 03/27/2021   CO2 23 03/27/2021   GLUCOSE 102 (H) 03/27/2021   BUN 11 03/27/2021   CREATININE 0.84 03/27/2021   CALCIUM 10.2 03/27/2021   EGFR 87 03/27/2021   GFRNONAA 94 11/16/2019   Lab Results  Component Value Date   CHOL 191 03/27/2021   HDL 49 03/27/2021   LDLCALC 120 (H) 03/27/2021   LDLDIRECT 134.7 08/26/2007   TRIG 122 03/27/2021   CHOLHDL 3.9 03/27/2021   Lab Results  Component Value Date   TSH 0.93 02/21/2021   Lab Results  Component Value Date   HGBA1C 6.2 (H) 03/27/2021   Lab Results  Component Value Date   WBC 8.9 03/27/2021   HGB 13.3 03/27/2021   HCT 39.8 03/27/2021   MCV 82 03/27/2021   PLT 329 03/27/2021   Lab Results  Component Value Date   ALT 46 (H) 03/27/2021   AST 19 03/27/2021   ALKPHOS 83 03/27/2021   BILITOT 0.3 03/27/2021   Lab Results  Component Value Date   VD25OH 29.5 (L) 03/27/2021     Review of Systems  Genitourinary:  Positive for hematuria.   Patient Active Problem List   Diagnosis Date Noted   Vitamin D deficiency 03/27/2021   Encounter for screening colonoscopy    Pre-diabetes 11/07/2018   Mixed hyperlipidemia 11/05/2018   Hematuria, undiagnosed cause 05/30/2016   Environmental and seasonal allergies 02/08/2016   Essential hypertension 02/08/2016   Family history of colon cancer in mother 08/06/2015   ALLERGIC RHINITIS 08/16/2007   Dysmenorrhea 08/16/2007    Allergies   Allergen Reactions   Ceftin [Cefuroxime] Diarrhea   Bactrim [Sulfamethoxazole-Trimethoprim] Itching   Betadine [Povidone Iodine] Itching   Ciprofloxacin Hives   Neomycin-Bacitracin Zn-Polymyx Itching         Past Surgical History:  Procedure Laterality Date   CHOLECYSTECTOMY  2007   COLONOSCOPY  07/2015   COLONOSCOPY WITH PROPOFOL N/A 08/20/2020   Procedure: COLONOSCOPY WITH PROPOFOL;  Surgeon: Lucilla Lame, MD;  Location: [facility];  Service: Endoscopy;  Laterality: N/A;  priority 4   GANGLION CYST EXCISION     Cyst    Social History   Tobacco Use   Smoking status: Former    Packs/day: 1.00    Years: 12.00    Pack years: 12.00    Types: Cigarettes    Quit date: 07/18/2005    Years since quitting: 16.3   Smokeless tobacco: Never  Vaping Use   Vaping Use: Never used  Substance Use Topics   Alcohol use: No    Alcohol/week: 0.0 standard drinks   Drug use: No     Medication list has been reviewed and updated.  No outpatient medications have been marked as taking for the 11/28/21 encounter (Appointment) with Glean Hess, MD.    [facility] 2/9 Scores 10/30/2021 03/27/2021 01/29/2021 11/16/2019  PHQ - 2 Score 0 0  0 0  PHQ- 9 Score 0 0 0 -    GAD 7 : Generalized Anxiety Score 10/30/2021 03/27/2021 01/29/2021  Nervous, Anxious, on Edge 0 0 0  Control/stop worrying 0 0 0  Worry too much - different things 0 0 0  Trouble relaxing 0 0 0  Restless 0 0 0  Easily annoyed or irritable 0 0 0  Afraid - awful might happen 0 0 0  Total GAD 7 Score 0 0 0  Anxiety Difficulty Not difficult at all Not difficult at all Not difficult at all    BP Readings from Last 3 Encounters:  10/30/21 136/84  03/27/21 136/82  01/29/21 136/84    Physical Exam  Wt Readings from Last 3 Encounters:  10/30/21 286 lb (129.7 kg)  03/27/21 276 lb (125.2 kg)  01/29/21 276 lb (125.2 kg)    There were no vitals taken for this visit.  Assessment and Plan:

## 2021-12-12 ENCOUNTER — Encounter: Payer: Self-pay | Admitting: Internal Medicine

## 2021-12-23 ENCOUNTER — Encounter: Payer: Self-pay | Admitting: Internal Medicine

## 2021-12-23 ENCOUNTER — Ambulatory Visit
Admission: RE | Admit: 2021-12-23 | Discharge: 2021-12-23 | Disposition: A | Discharge status: Home / Self Care | Payer: BC Managed Care – PPO | Source: Ambulatory Visit | Attending: Internal Medicine | Admitting: Internal Medicine

## 2021-12-23 ENCOUNTER — Ambulatory Visit: Payer: BC Managed Care – PPO | Admitting: Internal Medicine

## 2021-12-23 ENCOUNTER — Other Ambulatory Visit: Payer: Self-pay

## 2021-12-23 VITALS — BP 135/80 | HR 110 | Ht 68.0 in | Wt 286.0 lb

## 2021-12-23 DIAGNOSIS — R109 Unspecified abdominal pain: Secondary | ICD-10-CM

## 2021-12-23 DIAGNOSIS — I10 Essential (primary) hypertension: Secondary | ICD-10-CM

## 2021-12-23 DIAGNOSIS — R319 Hematuria, unspecified: Secondary | ICD-10-CM

## 2021-12-23 DIAGNOSIS — R1030 Lower abdominal pain, unspecified: Secondary | ICD-10-CM | POA: Diagnosis not present

## 2021-12-23 DIAGNOSIS — K760 Fatty (change of) liver, not elsewhere classified: Secondary | ICD-10-CM | POA: Diagnosis not present

## 2021-12-23 DIAGNOSIS — N132 Hydronephrosis with renal and ureteral calculous obstruction: Secondary | ICD-10-CM | POA: Diagnosis not present

## 2021-12-23 DIAGNOSIS — K439 Ventral hernia without obstruction or gangrene: Secondary | ICD-10-CM | POA: Diagnosis not present

## 2021-12-23 DIAGNOSIS — K429 Umbilical hernia without obstruction or gangrene: Secondary | ICD-10-CM | POA: Diagnosis not present

## 2021-12-23 LAB — POCT URINALYSIS DIPSTICK
Bilirubin, UA: NEGATIVE
Glucose, UA: NEGATIVE
Ketones, UA: NEGATIVE
Nitrite, UA: NEGATIVE
Protein, UA: POSITIVE — AB
Spec Grav, UA: 1.015 (ref 1.010–1.025)
Urobilinogen, UA: 0.2 E.U./dL
pH, UA: 7.5 (ref 5.0–8.0)

## 2021-12-23 NOTE — Progress Notes (Signed)
Date:  12/23/2021   Name:  Deanna Potts   DOB:  11/28/1975   MRN:  [id number]   Chief Complaint: Flank Pain  Hematuria This is a recurrent problem. The current episode started 1 to 4 weeks ago. She describes the hematuria as gross hematuria. The pain is moderate. She describes her urine color as dark red. Irritative symptoms do not include frequency or urgency. Associated symptoms include abdominal pain and flank pain. Pertinent negatives include no chills, dysuria or fever.   Lab Results  Component Value Date   NA 139 03/27/2021   K 5.0 03/27/2021   CO2 23 03/27/2021   GLUCOSE 102 (H) 03/27/2021   BUN 11 03/27/2021   CREATININE 0.84 03/27/2021   CALCIUM 10.2 03/27/2021   EGFR 87 03/27/2021   GFRNONAA 94 11/16/2019   Lab Results  Component Value Date   CHOL 191 03/27/2021   HDL 49 03/27/2021   LDLCALC 120 (H) 03/27/2021   LDLDIRECT 134.7 08/26/2007   TRIG 122 03/27/2021   CHOLHDL 3.9 03/27/2021   Lab Results  Component Value Date   TSH 0.93 02/21/2021   Lab Results  Component Value Date   HGBA1C 6.2 (H) 03/27/2021   Lab Results  Component Value Date   WBC 8.9 03/27/2021   HGB 13.3 03/27/2021   HCT 39.8 03/27/2021   MCV 82 03/27/2021   PLT 329 03/27/2021   Lab Results  Component Value Date   ALT 46 (H) 03/27/2021   AST 19 03/27/2021   ALKPHOS 83 03/27/2021   BILITOT 0.3 03/27/2021   Lab Results  Component Value Date   VD25OH 29.5 (L) 03/27/2021     Review of Systems  Constitutional:  Negative for chills, diaphoresis, fatigue and fever.  Respiratory:  Negative for chest tightness and shortness of breath.   Gastrointestinal:  Positive for abdominal pain. Negative for blood in stool and constipation.  Genitourinary:  Positive for flank pain and hematuria. Negative for dysuria, frequency and urgency.  Neurological:  Negative for dizziness and headaches.   Patient Active Problem List   Diagnosis Date Noted   Vitamin D deficiency 03/27/2021    Encounter for screening colonoscopy    Pre-diabetes 11/07/2018   Mixed hyperlipidemia 11/05/2018   Hematuria, undiagnosed cause 05/30/2016   Environmental and seasonal allergies 02/08/2016   Essential hypertension 02/08/2016   Family history of colon cancer in mother 08/06/2015   ALLERGIC RHINITIS 08/16/2007   Dysmenorrhea 08/16/2007    Allergies  Allergen Reactions   Ceftin [Cefuroxime] Diarrhea   Bactrim [Sulfamethoxazole-Trimethoprim] Itching   Betadine [Povidone Iodine] Itching   Ciprofloxacin Hives   Neomycin-Bacitracin Zn-Polymyx Itching         Past Surgical History:  Procedure Laterality Date   CHOLECYSTECTOMY  2007   COLONOSCOPY  07/2015   COLONOSCOPY WITH PROPOFOL N/A 08/20/2020   Procedure: COLONOSCOPY WITH PROPOFOL;  Surgeon: Lucilla Lame, MD;  Location: [facility];  Service: Endoscopy;  Laterality: N/A;  priority 4   GANGLION CYST EXCISION     Cyst    Social History   Tobacco Use   Smoking status: Former    Packs/day: 1.00    Years: 12.00    Pack years: 12.00    Types: Cigarettes    Quit date: 07/18/2005    Years since quitting: 16.4   Smokeless tobacco: Never  Vaping Use   Vaping Use: Never used  Substance Use Topics   Alcohol use: No    Alcohol/week: 0.0 standard drinks  use: No  ° ° ° °Medication list has been reviewed and updated. ° °Current Meds  °Medication Sig  ° amLODipine (NORVASC) 10 MG tablet Take 1 tablet (10 mg total) by mouth daily.  ° Cholecalciferol (VITAMIN D3) 50 MCG (2000 UT) TABS Take by mouth daily.  ° loratadine (CLARITIN) 10 MG tablet Take 10 mg by mouth daily.  ° Multiple Vitamin (MULTIVITAMIN) tablet Take 1 tablet by mouth daily.  ° ° °PHQ 2/9 Scores 12/23/2021 11/28/2021 11/28/2021 10/30/2021  °PHQ - 2 Score 0 0 0 0  °PHQ- 9 Score 0 0 0 0  ° ° °GAD 7 : Generalized Anxiety Score 12/23/2021 11/28/2021 11/28/2021 10/30/2021  °Nervous, Anxious, on Edge 0 0 0 0  °Control/stop worrying 0 0 0 0  °Worry too much - different things 0 0  0 0  °Trouble relaxing 0 0 0 0  °Restless 0 0 0 0  °Easily annoyed or irritable 0 0 0 0  °Afraid - awful might happen 0 0 0 0  °Total GAD 7 Score 0 0 0 0  °Anxiety Difficulty Not difficult at all Not difficult at all Not difficult at all Not difficult at all  ° ° °BP Readings from Last 3 Encounters:  °12/23/21 135/80  °11/28/21 132/85  °10/30/21 136/84  ° ° °Physical Exam °Vitals and nursing note reviewed.  °Constitutional:   °   General: She is not in acute distress. °   Appearance: She is well-developed.  °HENT:  °   Head: Normocephalic and atraumatic.  °Cardiovascular:  °   Rate and Rhythm: Normal rate and regular rhythm.  °Pulmonary:  °   Effort: Pulmonary effort is normal. No respiratory distress.  °   Breath sounds: No wheezing or rhonchi.  °Abdominal:  °   Palpations: Abdomen is soft.  °   Tenderness: There is abdominal tenderness (low abdomen). There is right CVA tenderness. There is no left CVA tenderness.  °Musculoskeletal:  °   Right lower leg: No edema.  °   Left lower leg: No edema.  °Skin: °   General: Skin is warm and dry.  °   Findings: No rash.  °Neurological:  °   Mental Status: She is alert and oriented to person, place, and time.  °Psychiatric:     °   Mood and Affect: Mood normal.     °   Behavior: Behavior normal.  ° ° °Wt Readings from Last 3 Encounters:  °12/23/21 286 lb (129.7 kg)  °11/28/21 286 lb (129.7 kg)  °10/30/21 286 lb (129.7 kg)  ° ° °BP 135/80 (BP Location: Right Arm, Cuff Size: Large)    Pulse (!) 110    Ht 5' 8" (1.727 m)    Wt 286 lb (129.7 kg)    LMP 12/02/2021 (Exact Date)    SpO2 98%    BMI 43.49 kg/m²  ° °Assessment and Plan: °1. Hematuria, unspecified type °With hx of left renal stone. °UA shows only blood - need CT to further evaluate °- POCT Urinalysis Dipstick °- CT RENAL STONE STUDY ° °2. Right flank pain °Continue advil or tylenol as needed °- CT RENAL STONE STUDY ° °3. Lower abdominal pain °Exam is benign ° °4. Essential hypertension °Slightly elevated to day due to  pain ° ° °Partially dictated using Dragon software. Any errors are unintentional. ° °Laura Berglund, MD °[facility] °[facility] ° °12/23/2021 ° ° ° ° ° °

## 2021-12-24 ENCOUNTER — Encounter: Payer: Self-pay | Admitting: Internal Medicine

## 2021-12-25 ENCOUNTER — Telehealth: Payer: Self-pay

## 2021-12-25 ENCOUNTER — Encounter: Payer: Self-pay | Admitting: Internal Medicine

## 2021-12-25 NOTE — Telephone Encounter (Signed)
Called Brandie left VM stating that I was unaware of what appointment was coming up or what authorization she was talking about. Need more information.  [facility] nurse may give results to patient if they return call to clinic, a CRM has been created.  KP

## 2021-12-25 NOTE — Telephone Encounter (Signed)
Copied from CRM [id number]. Topic: General - Other >> Dec 25, 2021 10:15 AM Darron Doom wrote: Reason for CRM: [facility] called in to inquire if someone was working on an authorization for patients upcoming appointment. Please call Brandie at Ph#  [phone] ext# [phone]

## 2022-02-20 ENCOUNTER — Encounter: Payer: Self-pay | Admitting: Internal Medicine

## 2022-02-20 ENCOUNTER — Other Ambulatory Visit: Payer: Self-pay

## 2022-02-20 MED ORDER — NITROFURANTOIN MONOHYD MACRO 100 MG PO CAPS: 100.0000 mg | ORAL_CAPSULE | Freq: Two times a day (BID) | ORAL | 0 refills | Status: DC

## 2022-03-10 ENCOUNTER — Ambulatory Visit: Payer: BC Managed Care – PPO | Admitting: Internal Medicine

## 2022-03-10 ENCOUNTER — Encounter: Payer: Self-pay | Admitting: Internal Medicine

## 2022-03-10 VITALS — BP 136/82 | HR 95 | Ht 68.0 in | Wt 287.0 lb

## 2022-03-10 DIAGNOSIS — N200 Calculus of kidney: Secondary | ICD-10-CM | POA: Diagnosis not present

## 2022-03-10 DIAGNOSIS — R300 Dysuria: Secondary | ICD-10-CM | POA: Diagnosis not present

## 2022-03-10 LAB — POC URINALYSIS WITH MICROSCOPIC (NON AUTO)MANUAL RESULT
Bacteria, UA: 0
Bilirubin, UA: NEGATIVE
Crystals: 0
Epithelial cells, urine per micros: 0
Glucose, UA: NEGATIVE
Ketones, UA: NEGATIVE
Mucus, UA: 0
Nitrite, UA: NEGATIVE
Protein, UA: NEGATIVE
RBC: 5 M/uL (ref 4.04–5.48)
Spec Grav, UA: <=1.005 — AB (ref 1.010–1.025)
Urobilinogen, UA: 0.2 E.U./dL
WBC Casts, UA: 1
pH, UA: 6.5 (ref 5.0–8.0)

## 2022-03-10 NOTE — Progress Notes (Signed)
? ? ?Date:  03/10/2022  ? ?Name:  Deanna Potts   DOB:  Jun 26, 1976   MRN:  [id number] ? ? ?Chief Complaint: Flank Pain ? ?Flank Pain ?This is a new problem. Episode onset: 2 weeks. The problem occurs intermittently. The problem has been gradually worsening since onset. The quality of the pain is described as stabbing and cramping. The pain does not radiate. Associated symptoms include bladder incontinence and dysuria. Pertinent negatives include no bowel incontinence, fever, headaches or pelvic pain. (hematuria)  ? ?Lab Results  ?Component Value Date  ? NA 139 03/27/2021  ? K 5.0 03/27/2021  ? CO2 23 03/27/2021  ? GLUCOSE 102 (H) 03/27/2021  ? BUN 11 03/27/2021  ? CREATININE 0.84 03/27/2021  ? CALCIUM 10.2 03/27/2021  ? EGFR 87 03/27/2021  ? GFRNONAA 94 11/16/2019  ? ?Lab Results  ?Component Value Date  ? CHOL 191 03/27/2021  ? HDL 49 03/27/2021  ? LDLCALC 120 (H) 03/27/2021  ? LDLDIRECT 134.7 08/26/2007  ? TRIG 122 03/27/2021  ? CHOLHDL 3.9 03/27/2021  ? ?Lab Results  ?Component Value Date  ? TSH 0.93 02/21/2021  ? ?Lab Results  ?Component Value Date  ? HGBA1C 6.2 (H) 03/27/2021  ? ?Lab Results  ?Component Value Date  ? WBC 8.9 03/27/2021  ? HGB 13.3 03/27/2021  ? HCT 39.8 03/27/2021  ? MCV 82 03/27/2021  ? PLT 329 03/27/2021  ? ?Lab Results  ?Component Value Date  ? ALT 46 (H) 03/27/2021  ? AST 19 03/27/2021  ? ALKPHOS 83 03/27/2021  ? BILITOT 0.3 03/27/2021  ? ?Lab Results  ?Component Value Date  ? VD25OH 29.5 (L) 03/27/2021  ?  ? ?Review of Systems  ?Constitutional:  Negative for chills and fever.  ?Gastrointestinal:  Negative for bowel incontinence.  ?Genitourinary:  Positive for bladder incontinence, dysuria and flank pain. Negative for pelvic pain.  ?Neurological:  Negative for headaches.  ? ?Patient Active Problem List  ? Diagnosis Date Noted  ? Vitamin D deficiency 03/27/2021  ? Encounter for screening colonoscopy   ? Pre-diabetes 11/07/2018  ? Mixed hyperlipidemia 11/05/2018  ? Hematuria, undiagnosed  cause 05/30/2016  ? Environmental and seasonal allergies 02/08/2016  ? Essential hypertension 02/08/2016  ? Family history of colon cancer in mother 08/06/2015  ? ALLERGIC RHINITIS 08/16/2007  ? Dysmenorrhea 08/16/2007  ? ? ?Allergies  ?Allergen Reactions  ? Ceftin [Cefuroxime] Diarrhea  ? Bactrim [Sulfamethoxazole-Trimethoprim] Itching  ? Betadine [Povidone Iodine] Itching  ? Ciprofloxacin Hives  ? Neomycin-Bacitracin Zn-Polymyx Itching  ?     ? ? ?Past Surgical History:  ?Procedure Laterality Date  ? CHOLECYSTECTOMY  2007  ? COLONOSCOPY  07/2015  ? COLONOSCOPY WITH PROPOFOL N/A 08/20/2020  ? Procedure: COLONOSCOPY WITH PROPOFOL;  Surgeon: Lucilla Lame, MD;  Location: [facility];  Service: Endoscopy;  Laterality: N/A;  priority 4  ? GANGLION CYST EXCISION    ? Cyst  ? ? ?Social History  ? ?Tobacco Use  ? Smoking status: Former  ?  Packs/day: 1.00  ?  Years: 12.00  ?  Pack years: 12.00  ?  Types: Cigarettes  ?  Quit date: 07/18/2005  ?  Years since quitting: 16.6  ? Smokeless tobacco: Never  ?Vaping Use  ? Vaping Use: Never used  ?Substance Use Topics  ? Alcohol use: No  ?  Alcohol/week: 0.0 standard drinks  ? Drug use: No  ? ? ? ?Medication list has been reviewed and updated. ? ?Current Meds  ?Medication Sig  ? amLODipine (NORVASC)  10 MG tablet Take 1 tablet (10 mg total) by mouth daily.  ? Cholecalciferol (VITAMIN D3) 50 MCG (2000 UT) TABS Take by mouth daily.  ? loratadine (CLARITIN) 10 MG tablet Take 10 mg by mouth daily.  ? Multiple Vitamin (MULTIVITAMIN) tablet Take 1 tablet by mouth daily.  ? ? ? ?  03/10/2022  ? 11:32 AM 12/23/2021  ?  9:20 AM 11/28/2021  ?  9:05 AM 11/28/2021  ?  8:54 AM  ?GAD 7 : Generalized Anxiety Score  ?Nervous, Anxious, on Edge 0 0 0 0  ?Control/stop worrying 0 0 0 0  ?Worry too much - different things 0 0 0 0  ?Trouble relaxing 0 0 0 0  ?Restless 0 0 0 0  ?Easily annoyed or irritable 0 0 0 0  ?Afraid - awful might happen 0 0 0 0  ?Total GAD 7 Score 0 0 0 0  ?Anxiety Difficulty Not  difficult at all Not difficult at all Not difficult at all Not difficult at all  ? ? ? ?  03/10/2022  ? 11:32 AM  ?Depression screen PHQ 2/9  ?Decreased Interest 0  ?Down, Depressed, Hopeless 0  ?PHQ - 2 Score 0  ?Altered sleeping 0  ?Tired, decreased energy 0  ?Change in appetite 0  ?Feeling bad or failure about yourself  0  ?Trouble concentrating 0  ?Moving slowly or fidgety/restless 0  ?Suicidal thoughts 0  ?PHQ-9 Score 0  ?Difficult doing work/chores Not difficult at all  ? ? ?BP Readings from Last 3 Encounters:  ?03/10/22 136/82  ?12/23/21 135/80  ?11/28/21 132/85  ? ? ?Physical Exam ?Vitals and nursing note reviewed.  ?Constitutional:   ?   General: She is not in acute distress. ?   Appearance: Normal appearance. She is well-developed.  ?HENT:  ?   Head: Normocephalic and atraumatic.  ?Cardiovascular:  ?   Rate and Rhythm: Normal rate and regular rhythm.  ?Pulmonary:  ?   Effort: Pulmonary effort is normal. No respiratory distress.  ?   Breath sounds: No wheezing or rhonchi.  ?Abdominal:  ?   Palpations: Abdomen is soft.  ?   Tenderness: There is no abdominal tenderness. There is left CVA tenderness. There is no right CVA tenderness.  ?Skin: ?   General: Skin is warm and dry.  ?   Findings: No rash.  ?Neurological:  ?   Mental Status: She is alert and oriented to person, place, and time.  ?Psychiatric:     ?   Mood and Affect: Mood normal.     ?   Behavior: Behavior normal.  ? ? ?Wt Readings from Last 3 Encounters:  ?03/10/22 287 lb (130.2 kg)  ?12/23/21 286 lb (129.7 kg)  ?11/28/21 286 lb (129.7 kg)  ? ? ?BP 136/82 (BP Location: Right Arm, Cuff Size: Large)   Pulse 95   Ht $R'5\' 8"'UW$  (1.727 m)   Wt 287 lb (130.2 kg)   LMP 02/24/2022 (Approximate)   SpO2 97%   BMI 43.64 kg/m?  ? ?Assessment and Plan: ?1. Dysuria ?No evidence of infection on UA ?She recently completed a course of Macrobid so consider UTI treated ?- POC urinalysis w microscopic (non auto) ? ?2. Left renal stone ?Suspect this is the source of her  discomfort and likely to pass according the size on the previous CT scan. ?Resume Flomax ?Push fluids ?Call if symptoms worsen ? ? ?Partially dictated using Editor, commissioning. Any errors are unintentional. ? ?Halina Maidens, MD ?[facility] ?[facility] ? ?03/10/2022 ? ? ? ? ?

## 2022-04-02 ENCOUNTER — Encounter: Payer: BC Managed Care – PPO | Admitting: Internal Medicine

## 2022-05-09 ENCOUNTER — Other Ambulatory Visit: Payer: Self-pay | Admitting: Internal Medicine

## 2022-05-09 DIAGNOSIS — I10 Essential (primary) hypertension: Secondary | ICD-10-CM

## 2022-07-08 ENCOUNTER — Other Ambulatory Visit: Payer: Self-pay | Admitting: Internal Medicine

## 2022-07-08 ENCOUNTER — Encounter: Payer: Self-pay | Admitting: Internal Medicine

## 2022-07-08 ENCOUNTER — Ambulatory Visit: Payer: BC Managed Care – PPO | Admitting: Internal Medicine

## 2022-07-08 VITALS — BP 132/74 | HR 100 | Ht 68.0 in | Wt 291.0 lb

## 2022-07-08 DIAGNOSIS — Z23 Encounter for immunization: Secondary | ICD-10-CM

## 2022-07-08 DIAGNOSIS — N3001 Acute cystitis with hematuria: Secondary | ICD-10-CM

## 2022-07-08 LAB — POCT URINALYSIS DIPSTICK
Bilirubin, UA: NEGATIVE
Glucose, UA: POSITIVE — AB
Ketones, UA: NEGATIVE
Nitrite, UA: NEGATIVE
Protein, UA: NEGATIVE
Spec Grav, UA: 1.01 (ref 1.010–1.025)
Urobilinogen, UA: 0.2 E.U./dL
pH, UA: 6 (ref 5.0–8.0)

## 2022-07-08 MED ORDER — NITROFURANTOIN MONOHYD MACRO 100 MG PO CAPS: 100.0000 mg | ORAL_CAPSULE | Freq: Two times a day (BID) | ORAL | 0 refills | Status: DC

## 2022-07-08 NOTE — Progress Notes (Signed)
Date:  07/08/2022   Name:  Deanna Potts   DOB:  [birth year]   MRN:  [id number]   Chief Complaint: Urinary Tract Infection (Started Sunday- 2 days ago. Frequency, foul odor, and burning while urinating. Also, having back pain, and flank pain.)  Urinary Tract Infection  This is a new problem. The current episode started yesterday. The problem occurs every urination. The problem has been gradually worsening. The quality of the pain is described as burning. The pain is mild. There has been no fever. Associated symptoms include flank pain, frequency and urgency. Pertinent negatives include no chills, nausea or vomiting. She has tried increased fluids for the symptoms. The treatment provided mild relief.    Lab Results  Component Value Date   NA 139 03/27/2021   K 5.0 03/27/2021   CO2 23 03/27/2021   GLUCOSE 102 (H) 03/27/2021   BUN 11 03/27/2021   CREATININE 0.84 03/27/2021   CALCIUM 10.2 03/27/2021   EGFR 87 03/27/2021   GFRNONAA 94 11/16/2019   Lab Results  Component Value Date   CHOL 191 03/27/2021   HDL 49 03/27/2021   LDLCALC 120 (H) 03/27/2021   LDLDIRECT 134.7 08/26/2007   TRIG 122 03/27/2021   CHOLHDL 3.9 03/27/2021   Lab Results  Component Value Date   TSH 0.93 02/21/2021   Lab Results  Component Value Date   HGBA1C 6.2 (H) 03/27/2021   Lab Results  Component Value Date   WBC 8.9 03/27/2021   HGB 13.3 03/27/2021   HCT 39.8 03/27/2021   MCV 82 03/27/2021   PLT 329 03/27/2021   Lab Results  Component Value Date   ALT 46 (H) 03/27/2021   AST 19 03/27/2021   ALKPHOS 83 03/27/2021   BILITOT 0.3 03/27/2021   Lab Results  Component Value Date   VD25OH 29.5 (L) 03/27/2021     Review of Systems  Constitutional:  Negative for chills, fatigue and fever.  Gastrointestinal:  Negative for nausea and vomiting.  Genitourinary:  Positive for flank pain, frequency and urgency.  Neurological:  Negative for dizziness and headaches.    Patient Active Problem  List   Diagnosis Date Noted   Vitamin D deficiency 03/27/2021   Encounter for screening colonoscopy    Pre-diabetes 11/07/2018   Mixed hyperlipidemia 11/05/2018   Hematuria, undiagnosed cause 05/30/2016   Environmental and seasonal allergies 02/08/2016   Essential hypertension 02/08/2016   Family history of colon cancer in mother 08/06/2015   ALLERGIC RHINITIS 08/16/2007   Dysmenorrhea 08/16/2007    Allergies  Allergen Reactions   Ceftin [Cefuroxime] Diarrhea   Bactrim [Sulfamethoxazole-Trimethoprim] Itching   Betadine [Povidone Iodine] Itching   Ciprofloxacin Hives   Neomycin-Bacitracin Zn-Polymyx Itching         Past Surgical History:  Procedure Laterality Date   CHOLECYSTECTOMY  2007   COLONOSCOPY  07/2015   COLONOSCOPY WITH PROPOFOL N/A 08/20/2020   Procedure: COLONOSCOPY WITH PROPOFOL;  Surgeon: Lucilla Lame, MD;  Location: [place];  Service: Endoscopy;  Laterality: N/A;  priority 4   GANGLION CYST EXCISION     Cyst    Social History   Tobacco Use   Smoking status: Former    Packs/day: 1.00    Years: 12.00    Total pack years: 12.00    Types: Cigarettes    Quit date: 07/18/2005    Years since quitting: 16.9   Smokeless tobacco: Never  Vaping Use   Vaping Use: Never used  Substance Use Topics  Alcohol use: No    Alcohol/week: 0.0 standard drinks of alcohol   Drug use: No     Medication list has been reviewed and updated.  Current Meds  Medication Sig   amLODipine (NORVASC) 10 MG tablet Take 1 tablet by mouth daily.   Cholecalciferol (VITAMIN D3) 50 MCG (2000 UT) TABS Take by mouth daily.   loratadine (CLARITIN) 10 MG tablet Take 10 mg by mouth daily.   Multiple Vitamin (MULTIVITAMIN) tablet Take 1 tablet by mouth daily.   nitrofurantoin, macrocrystal-monohydrate, (MACROBID) 100 MG capsule Take 1 capsule (100 mg total) by mouth 2 (two) times daily for 7 days.       07/08/2022    2:00 PM 03/10/2022   11:32 AM 12/23/2021    9:20 AM 11/28/2021     9:05 AM  GAD 7 : Generalized Anxiety Score  Nervous, Anxious, on Edge 0 0 0 0  Control/stop worrying 0 0 0 0  Worry too much - different things 0 0 0 0  Trouble relaxing 0 0 0 0  Restless 0 0 0 0  Easily annoyed or irritable 0 0 0 0  Afraid - awful might happen 0 0 0 0  Total GAD 7 Score 0 0 0 0  Anxiety Difficulty Not difficult at all Not difficult at all Not difficult at all Not difficult at all       07/08/2022    2:00 PM 03/10/2022   11:32 AM 12/23/2021    9:20 AM  Depression screen PHQ 2/9  Decreased Interest 0 0 0  Down, Depressed, Hopeless 0 0 0  PHQ - 2 Score 0 0 0  Altered sleeping 0 0 0  Tired, decreased energy 0 0 0  Change in appetite 0 0 0  Feeling bad or failure about yourself  0 0 0  Trouble concentrating 0 0 0  Moving slowly or fidgety/restless 0 0 0  Suicidal thoughts 0 0 0  PHQ-9 Score 0 0 0  Difficult doing work/chores Not difficult at all Not difficult at all Not difficult at all    BP Readings from Last 3 Encounters:  07/08/22 132/74  03/10/22 136/82  12/23/21 135/80    Physical Exam Vitals and nursing note reviewed.  Constitutional:      Appearance: She is well-developed.  Cardiovascular:     Rate and Rhythm: Normal rate and regular rhythm.     Heart sounds: Normal heart sounds.  Pulmonary:     Effort: Pulmonary effort is normal. No respiratory distress.     Breath sounds: Normal breath sounds.  Abdominal:     General: Bowel sounds are normal.     Palpations: Abdomen is soft.     Tenderness: There is abdominal tenderness in the suprapubic area. There is no right CVA tenderness, left CVA tenderness, guarding or rebound.     Wt Readings from Last 3 Encounters:  07/08/22 291 lb (132 kg)  03/10/22 287 lb (130.2 kg)  12/23/21 286 lb (129.7 kg)    BP 132/74   Pulse 100   Ht '5\' 8"'  (1.727 m)   Wt 291 lb (132 kg)   LMP 01/15/2022 (Approximate)   SpO2 97%   BMI 44.25 kg/m   Assessment and Plan: 1. Acute cystitis with  hematuria Continue to push fluids Will treat empirically and adjust per culture - POCT Urinalysis Dipstick - Urine Culture - nitrofurantoin, macrocrystal-monohydrate, (MACROBID) 100 MG capsule; Take 1 capsule (100 mg total) by mouth 2 (two) times daily for 7 days.  Dispense: 14 capsule; Refill: 0  2. Need for immunization against influenza - Flu Vaccine QUAD 93moIM (Fluarix, Fluzone & Alfiuria Quad PF)   Partially dictated using DEditor, commissioning Any errors are unintentional.  LHalina Maidens MD [facility]  07/08/2022

## 2022-07-10 LAB — URINE CULTURE

## 2022-07-14 ENCOUNTER — Other Ambulatory Visit: Payer: Self-pay | Admitting: Internal Medicine

## 2022-07-14 ENCOUNTER — Encounter: Payer: Self-pay | Admitting: Internal Medicine

## 2022-07-14 DIAGNOSIS — N3001 Acute cystitis with hematuria: Secondary | ICD-10-CM

## 2022-07-14 MED ORDER — NITROFURANTOIN MONOHYD MACRO 100 MG PO CAPS: 100.0000 mg | ORAL_CAPSULE | Freq: Two times a day (BID) | ORAL | 0 refills | Status: AC

## 2022-08-09 ENCOUNTER — Other Ambulatory Visit: Payer: Self-pay | Admitting: Internal Medicine

## 2022-08-09 DIAGNOSIS — I10 Essential (primary) hypertension: Secondary | ICD-10-CM

## 2022-08-11 NOTE — Telephone Encounter (Signed)
Requested Prescriptions  Pending Prescriptions Disp Refills  . amLODipine (NORVASC) 10 MG tablet [Pharmacy Med Name: Amlodipine Besylate 10mg  Tablet] 90 tablet 0    Sig: Take 1 tablet by mouth daily.     Cardiovascular: Calcium Channel Blockers 2 Passed - 08/09/2022  1:51 PM      Passed - Last BP in normal range    BP Readings from Last 1 Encounters:  07/08/22 132/74         Passed - Last Heart Rate in normal range    Pulse Readings from Last 1 Encounters:  07/08/22 100         Passed - Valid encounter within last 6 months    Recent Outpatient Visits          1 month ago Acute cystitis with hematuria   [facility], Jesse Sans, MD   5 months ago [facility] at [facility], Jesse Sans, MD   7 months ago Hematuria, unspecified type   [facility], Jesse Sans, MD   8 months ago Other microscopic hematuria   [facility] at [facility], Jesse Sans, MD   9 months ago Gross hematuria   [facility] at [facility], Jesse Sans, MD      Future Appointments            In 1 month [facility], Jesse Sans, MD [facility] at [facility], [facility]

## 2022-08-19 DIAGNOSIS — Z01419 Encounter for gynecological examination (general) (routine) without abnormal findings: Secondary | ICD-10-CM | POA: Diagnosis not present

## 2022-08-19 DIAGNOSIS — Z1231 Encounter for screening mammogram for malignant neoplasm of breast: Secondary | ICD-10-CM | POA: Diagnosis not present

## 2022-08-19 DIAGNOSIS — Z6841 Body Mass Index (BMI) 40.0 and over, adult: Secondary | ICD-10-CM | POA: Diagnosis not present

## 2022-08-19 LAB — HM MAMMOGRAPHY

## 2022-09-10 ENCOUNTER — Encounter: Payer: BC Managed Care – PPO | Admitting: Internal Medicine

## 2022-10-24 ENCOUNTER — Other Ambulatory Visit: Payer: Self-pay | Admitting: Internal Medicine

## 2022-10-24 DIAGNOSIS — I10 Essential (primary) hypertension: Secondary | ICD-10-CM

## 2022-12-18 ENCOUNTER — Ambulatory Visit (INDEPENDENT_AMBULATORY_CARE_PROVIDER_SITE_OTHER): Payer: BC Managed Care – PPO | Admitting: Internal Medicine

## 2022-12-18 ENCOUNTER — Encounter: Payer: Self-pay | Admitting: Internal Medicine

## 2022-12-18 VITALS — BP 124/78 | HR 81 | Ht 68.0 in | Wt 290.8 lb

## 2022-12-18 DIAGNOSIS — R7303 Prediabetes: Secondary | ICD-10-CM

## 2022-12-18 DIAGNOSIS — I10 Essential (primary) hypertension: Secondary | ICD-10-CM | POA: Diagnosis not present

## 2022-12-18 DIAGNOSIS — Z1231 Encounter for screening mammogram for malignant neoplasm of breast: Secondary | ICD-10-CM | POA: Diagnosis not present

## 2022-12-18 DIAGNOSIS — E559 Vitamin D deficiency, unspecified: Secondary | ICD-10-CM

## 2022-12-18 DIAGNOSIS — E782 Mixed hyperlipidemia: Secondary | ICD-10-CM

## 2022-12-18 DIAGNOSIS — Z0000 Encounter for general adult medical examination without abnormal findings: Secondary | ICD-10-CM

## 2022-12-18 NOTE — Assessment & Plan Note (Signed)
Continue low carb diet choices, weight management.

## 2022-12-18 NOTE — Assessment & Plan Note (Signed)
Clinically stable exam with well controlled BP on amlodipine. Tolerating medications without side effects. Pt to continue current regimen and low sodium diet.  

## 2022-12-18 NOTE — Progress Notes (Signed)
Date:  12/18/2022   Name:  Deanna Potts   DOB:  [birth year]   MRN:  [id number]   Chief Complaint: Annual Exam Deanna Potts is a 47 y.o. female who presents today for her Complete Annual Exam. She feels well. She reports walking. She reports she is sleeping well. Breast complaints - none.  Mammogram: 08/2022 @ [facility]: none Pap smear: 02/2021 @ [facility] Colonoscopy: 08/2020 repeat 10 yrs  Health Maintenance Due  Topic Date Due   HIV Screening  Never done   MAMMOGRAM  02/21/2022    Immunization History  Administered Date(s) Administered   Influenza Split 10/22/2011   Influenza Whole 08/26/2010   Influenza, Quadrivalent, Recombinant, Inj, Pf 09/14/2019   Influenza,inj,Quad PF,6+ Mos 08/22/2015, 08/04/2016, 07/08/2022   Influenza-Unspecified 08/21/2017, 08/20/2018, 07/28/2020   PFIZER(Purple Top)SARS-COV-2 Vaccination 02/14/2020, 03/06/2020, 09/10/2020   Td 06/05/2003   Tdap 02/08/2016    Hypertension This is a chronic problem. The problem is controlled. Pertinent negatives include no chest pain, headaches, palpitations or shortness of breath. Past treatments include calcium channel blockers. The current treatment provides significant improvement. There is no history of kidney disease, CAD/MI or CVA.    Lab Results  Component Value Date   NA 139 03/27/2021   K 5.0 03/27/2021   CO2 23 03/27/2021   GLUCOSE 102 (H) 03/27/2021   BUN 11 03/27/2021   CREATININE 0.84 03/27/2021   CALCIUM 10.2 03/27/2021   EGFR 87 03/27/2021   GFRNONAA 94 11/16/2019   Lab Results  Component Value Date   CHOL 191 03/27/2021   HDL 49 03/27/2021   LDLCALC 120 (H) 03/27/2021   LDLDIRECT 134.7 08/26/2007   TRIG 122 03/27/2021   CHOLHDL 3.9 03/27/2021   Lab Results  Component Value Date   TSH 0.93 02/21/2021   Lab Results  Component Value Date   HGBA1C 6.2 (H) 03/27/2021   Lab Results  Component Value Date   WBC 8.9 03/27/2021   HGB 13.3  03/27/2021   HCT 39.8 03/27/2021   MCV 82 03/27/2021   PLT 329 03/27/2021   Lab Results  Component Value Date   ALT 46 (H) 03/27/2021   AST 19 03/27/2021   ALKPHOS 83 03/27/2021   BILITOT 0.3 03/27/2021   Lab Results  Component Value Date   VD25OH 29.5 (L) 03/27/2021     Review of Systems  Constitutional:  Negative for chills, fatigue and fever.  HENT:  Positive for congestion. Negative for hearing loss, tinnitus, trouble swallowing and voice change.   Eyes:  Negative for visual disturbance.  Respiratory:  Negative for cough, chest tightness, shortness of breath and wheezing.   Cardiovascular:  Negative for chest pain, palpitations and leg swelling.  Gastrointestinal:  Negative for abdominal pain, constipation, diarrhea and vomiting.  Endocrine: Negative for polydipsia and polyuria.  Genitourinary:  Positive for menstrual problem (no menses since 10/23). Negative for dysuria, frequency, genital sores, vaginal bleeding and vaginal discharge.  Musculoskeletal:  Negative for arthralgias, gait problem and joint swelling.  Skin:  Negative for color change and rash.  Neurological:  Negative for dizziness, tremors, light-headedness and headaches.  Hematological:  Negative for adenopathy. Does not bruise/bleed easily.  Psychiatric/Behavioral:  Negative for dysphoric mood and sleep disturbance. The patient is not nervous/anxious.     Patient Active Problem List   Diagnosis Date Noted   Vitamin D deficiency 03/27/2021   Pre-diabetes 11/07/2018   Mixed hyperlipidemia 11/05/2018   Hematuria, undiagnosed cause 05/30/2016   Environmental  and seasonal allergies 02/08/2016   Essential hypertension 02/08/2016   Family history of colon cancer in mother 08/06/2015   Dysmenorrhea 08/16/2007    Allergies  Allergen Reactions   Ceftin [Cefuroxime] Diarrhea   Bactrim [Sulfamethoxazole-Trimethoprim] Itching   Betadine [Povidone Iodine] Itching   Ciprofloxacin Hives   Neomycin-Bacitracin  Zn-Polymyx Itching         Past Surgical History:  Procedure Laterality Date   CHOLECYSTECTOMY  2007   COLONOSCOPY  07/2015   COLONOSCOPY WITH PROPOFOL N/A 08/20/2020   Procedure: COLONOSCOPY WITH PROPOFOL;  Surgeon: Lucilla Lame, MD;  Location: [place];  Service: Endoscopy;  Laterality: N/A;  priority 4   GANGLION CYST EXCISION     Cyst    Social History   Tobacco Use   Smoking status: Former    Packs/day: 1.00    Years: 12.00    Total pack years: 12.00    Types: Cigarettes    Quit date: 07/18/2005    Years since quitting: 17.4   Smokeless tobacco: Never  Vaping Use   Vaping Use: Never used  Substance Use Topics   Alcohol use: No    Alcohol/week: 0.0 standard drinks of alcohol   Drug use: No     Medication list has been reviewed and updated.  Current Meds  Medication Sig   amLODipine (NORVASC) 10 MG tablet Take 1 tablet by mouth daily.   Cholecalciferol (VITAMIN D3) 50 MCG (2000 UT) TABS Take by mouth daily.   loratadine (CLARITIN) 10 MG tablet Take 10 mg by mouth daily.   Multiple Vitamin (MULTIVITAMIN) tablet Take 1 tablet by mouth daily.       12/18/2022    9:40 AM 07/08/2022    2:00 PM 03/10/2022   11:32 AM 12/23/2021    9:20 AM  GAD 7 : Generalized Anxiety Score  Nervous, Anxious, on Edge 0 0 0 0  Control/stop worrying 0 0 0 0  Worry too much - different things 0 0 0 0  Trouble relaxing 0 0 0 0  Restless 0 0 0 0  Easily annoyed or irritable 0 0 0 0  Afraid - awful might happen 0 0 0 0  Total GAD 7 Score 0 0 0 0  Anxiety Difficulty Not difficult at all Not difficult at all Not difficult at all Not difficult at all       12/18/2022    9:40 AM 07/08/2022    2:00 PM 03/10/2022   11:32 AM  Depression screen PHQ 2/9  Decreased Interest 0 0 0  Down, Depressed, Hopeless 0 0 0  PHQ - 2 Score 0 0 0  Altered sleeping 0 0 0  Tired, decreased energy 0 0 0  Change in appetite 0 0 0  Feeling bad or failure about yourself  0 0 0  Trouble concentrating 0  0 0  Moving slowly or fidgety/restless 0 0 0  Suicidal thoughts 0 0 0  PHQ-9 Score 0 0 0  Difficult doing work/chores Not difficult at all Not difficult at all Not difficult at all    BP Readings from Last 3 Encounters:  12/18/22 124/78  07/08/22 132/74  03/10/22 136/82    Physical Exam Vitals and nursing note reviewed.  Constitutional:      General: She is not in acute distress.    Appearance: She is well-developed.  HENT:     Head: Normocephalic and atraumatic.     Right Ear: Tympanic membrane and ear canal normal.     Left  Ear: Tympanic membrane and ear canal normal.     Nose:     Right Sinus: No maxillary sinus tenderness.     Left Sinus: No maxillary sinus tenderness.  Eyes:     General: No scleral icterus.       Right eye: No discharge.        Left eye: No discharge.     Conjunctiva/sclera: Conjunctivae normal.  Neck:     Thyroid: No thyromegaly.     Vascular: No carotid bruit.  Cardiovascular:     Rate and Rhythm: Normal rate and regular rhythm.     Pulses: Normal pulses.     Heart sounds: Normal heart sounds.  Pulmonary:     Effort: Pulmonary effort is normal. No respiratory distress.     Breath sounds: No wheezing.  Abdominal:     General: Bowel sounds are normal.     Palpations: Abdomen is soft.     Tenderness: There is no abdominal tenderness.  Musculoskeletal:     Cervical back: Normal range of motion. No erythema.     Right lower leg: No edema.     Left lower leg: No edema.  Lymphadenopathy:     Cervical: No cervical adenopathy.  Skin:    General: Skin is warm and dry.     Findings: No rash.  Neurological:     Mental Status: She is alert and oriented to person, place, and time.     Cranial Nerves: No cranial nerve deficit.     Sensory: No sensory deficit.     Deep Tendon Reflexes: Reflexes are normal and symmetric.  Psychiatric:        Attention and Perception: Attention normal.        Mood and Affect: Mood normal.     Wt Readings from Last  3 Encounters:  12/18/22 290 lb 12.8 oz (131.9 kg)  07/08/22 291 lb (132 kg)  03/10/22 287 lb (130.2 kg)    BP 124/78   Pulse 81   Ht 5\' 8"  (1.727 m)   Wt 290 lb 12.8 oz (131.9 kg)   SpO2 98%   BMI 44.22 kg/m   Assessment and Plan: Problem List Items Addressed This Visit       Cardiovascular and Mediastinum   Essential hypertension (Chronic)    Clinically stable exam with well controlled BP on amlodipine. Tolerating medications without side effects. Pt to continue current regimen and low sodium diet.       Relevant Orders   CBC with Differential/Platelet   Comprehensive metabolic panel   TSH     Other   Vitamin D deficiency    Last level < 30. On daily supplement      Relevant Orders   VITAMIN D 25 Hydroxy (Vit-D Deficiency, Fractures)   Mixed hyperlipidemia (Chronic)    LDL is  Lab Results  Component Value Date   LDLCALC 120 (H) 03/27/2021  Continue low fat diet.  If lipids are higher, consider medication.       Relevant Orders   Lipid panel   Pre-diabetes (Chronic)    Continue low carb diet choices, weight management.      Relevant Orders   Hemoglobin A1c   Other Visit Diagnoses     Annual physical exam    -  Primary   Up to date on screenings and immunizations work on healthy diet and exercise   Encounter for screening mammogram for breast cancer       will request report from GYN  Partially dictated using Editor, commissioning. Any errors are unintentional.  Halina Maidens, MD [facility]  12/18/2022

## 2022-12-18 NOTE — Assessment & Plan Note (Signed)
Last level < 30. On daily supplement

## 2022-12-18 NOTE — Assessment & Plan Note (Signed)
LDL is  Lab Results  Component Value Date   LDLCALC 120 (H) 03/27/2021  Continue low fat diet.  If lipids are higher, consider medication.

## 2022-12-19 ENCOUNTER — Other Ambulatory Visit: Payer: Self-pay | Admitting: Internal Medicine

## 2022-12-19 ENCOUNTER — Other Ambulatory Visit: Payer: Self-pay

## 2022-12-19 DIAGNOSIS — E118 Type 2 diabetes mellitus with unspecified complications: Secondary | ICD-10-CM

## 2022-12-19 LAB — LIPID PANEL
Chol/HDL Ratio: 5.6 ratio — ABNORMAL HIGH (ref 0.0–4.4)
Cholesterol, Total: 203 mg/dL — ABNORMAL HIGH (ref 100–199)
HDL: 36 mg/dL — ABNORMAL LOW (ref >39)
LDL Chol Calc (NIH): 140 mg/dL — ABNORMAL HIGH (ref 0–99)
Triglycerides: 149 mg/dL (ref 0–149)
VLDL Cholesterol Cal: 27 mg/dL (ref 5–40)

## 2022-12-19 LAB — COMPREHENSIVE METABOLIC PANEL
ALT: 63 IU/L — ABNORMAL HIGH (ref 0–32)
AST: 65 IU/L — ABNORMAL HIGH (ref 0–40)
Albumin/Globulin Ratio: 1.4 (ref 1.2–2.2)
Albumin: 4.2 g/dL (ref 3.9–4.9)
Alkaline Phosphatase: 85 IU/L (ref 44–121)
BUN/Creatinine Ratio: 11 (ref 9–23)
BUN: 8 mg/dL (ref 6–24)
Bilirubin Total: 0.4 mg/dL (ref 0.0–1.2)
CO2: 22 mmol/L (ref 20–29)
Calcium: 9.5 mg/dL (ref 8.7–10.2)
Chloride: 103 mmol/L (ref 96–106)
Creatinine, Ser: 0.76 mg/dL (ref 0.57–1.00)
Globulin, Total: 3 g/dL (ref 1.5–4.5)
Glucose: 99 mg/dL (ref 70–99)
Potassium: 4.4 mmol/L (ref 3.5–5.2)
Sodium: 140 mmol/L (ref 134–144)
Total Protein: 7.2 g/dL (ref 6.0–8.5)
eGFR: 97 mL/min/{1.73_m2} (ref >59)

## 2022-12-19 LAB — CBC WITH DIFFERENTIAL/PLATELET
Basophils Absolute: 0.1 10*3/uL (ref 0.0–0.2)
Basos: 1 %
EOS (ABSOLUTE): 0.3 10*3/uL (ref 0.0–0.4)
Eos: 3 %
Hematocrit: 41.4 % (ref 34.0–46.6)
Hemoglobin: 13.5 g/dL (ref 11.1–15.9)
Immature Grans (Abs): 0 10*3/uL (ref 0.0–0.1)
Immature Granulocytes: 0 %
Lymphocytes Absolute: 2.5 10*3/uL (ref 0.7–3.1)
Lymphs: 29 %
MCH: 27.3 pg (ref 26.6–33.0)
MCHC: 32.6 g/dL (ref 31.5–35.7)
MCV: 84 fL (ref 79–97)
Monocytes Absolute: 0.4 10*3/uL (ref 0.1–0.9)
Monocytes: 5 %
Neutrophils Absolute: 5.5 10*3/uL (ref 1.4–7.0)
Neutrophils: 62 %
Platelets: 321 10*3/uL (ref 150–450)
RBC: 4.95 x10E6/uL (ref 3.77–5.28)
RDW: 14.3 % (ref 11.7–15.4)
WBC: 8.8 10*3/uL (ref 3.4–10.8)

## 2022-12-19 LAB — TSH: TSH: 0.794 u[IU]/mL (ref 0.450–4.500)

## 2022-12-19 LAB — HEMOGLOBIN A1C
Est. average glucose Bld gHb Est-mCnc: 143 mg/dL
Hgb A1c MFr Bld: 6.6 % — ABNORMAL HIGH (ref 4.8–5.6)

## 2022-12-19 LAB — VITAMIN D 25 HYDROXY (VIT D DEFICIENCY, FRACTURES): Vit D, 25-Hydroxy: 51 ng/mL (ref 30.0–100.0)

## 2022-12-19 MED ORDER — METFORMIN HCL ER 500 MG PO TB24: 500.0000 mg | ORAL_TABLET | Freq: Every day | ORAL | 1 refills | Status: DC

## 2022-12-20 ENCOUNTER — Encounter: Payer: Self-pay | Admitting: Internal Medicine

## 2022-12-24 ENCOUNTER — Encounter: Payer: Self-pay | Admitting: Internal Medicine

## 2022-12-24 NOTE — Telephone Encounter (Signed)
Please review.  KP

## 2023-01-13 ENCOUNTER — Encounter: Payer: Self-pay | Admitting: Internal Medicine

## 2023-01-14 ENCOUNTER — Other Ambulatory Visit: Payer: Self-pay | Admitting: Internal Medicine

## 2023-01-14 DIAGNOSIS — R059 Cough, unspecified: Secondary | ICD-10-CM

## 2023-01-14 DIAGNOSIS — I10 Essential (primary) hypertension: Secondary | ICD-10-CM

## 2023-01-14 MED ORDER — BENZONATATE 100 MG PO CAPS: 100.0000 mg | ORAL_CAPSULE | Freq: Two times a day (BID) | ORAL | 0 refills | Status: DC | PRN

## 2023-02-16 ENCOUNTER — Other Ambulatory Visit: Payer: Self-pay | Admitting: Internal Medicine

## 2023-02-16 ENCOUNTER — Ambulatory Visit: Payer: BC Managed Care – PPO | Admitting: Internal Medicine

## 2023-02-16 VITALS — BP 134/80 | HR 116 | Ht 68.0 in | Wt 283.0 lb

## 2023-02-16 DIAGNOSIS — R399 Unspecified symptoms and signs involving the genitourinary system: Secondary | ICD-10-CM

## 2023-02-16 LAB — POCT URINALYSIS DIPSTICK
Bilirubin, UA: NEGATIVE
Blood, UA: NEGATIVE
Glucose, UA: NEGATIVE
Ketones, UA: NEGATIVE
Nitrite, UA: NEGATIVE
Protein, UA: NEGATIVE
Spec Grav, UA: 1.015 (ref 1.010–1.025)
Urobilinogen, UA: 0.2 E.U./dL
pH, UA: 6.5 (ref 5.0–8.0)

## 2023-02-16 NOTE — Progress Notes (Signed)
Date:  02/16/2023   Name:  Deanna Potts   DOB:  02-Jul-1976   MRN:  [id number]   Chief Complaint: Urinary Tract Infection  Urinary Tract Infection  This is a new problem. Episode onset: 2 days ago. The problem has been waxing and waning. There has been no fever. She is Sexually active. Associated symptoms include flank pain (left sided). Pertinent negatives include no chills, frequency, hematuria or urgency. She has tried increased fluids for the symptoms. The treatment provided mild relief.    Lab Results  Component Value Date   NA 140 12/18/2022   K 4.4 12/18/2022   CO2 22 12/18/2022   GLUCOSE 99 12/18/2022   BUN 8 12/18/2022   CREATININE 0.76 12/18/2022   CALCIUM 9.5 12/18/2022   EGFR 97 12/18/2022   GFRNONAA 94 11/16/2019   Lab Results  Component Value Date   CHOL 203 (H) 12/18/2022   HDL 36 (L) 12/18/2022   LDLCALC 140 (H) 12/18/2022   LDLDIRECT 134.7 08/26/2007   TRIG 149 12/18/2022   CHOLHDL 5.6 (H) 12/18/2022   Lab Results  Component Value Date   TSH 0.794 12/18/2022   Lab Results  Component Value Date   HGBA1C 6.6 (H) 12/18/2022   Lab Results  Component Value Date   WBC 8.8 12/18/2022   HGB 13.5 12/18/2022   HCT 41.4 12/18/2022   MCV 84 12/18/2022   PLT 321 12/18/2022   Lab Results  Component Value Date   ALT 63 (H) 12/18/2022   AST 65 (H) 12/18/2022   ALKPHOS 85 12/18/2022   BILITOT 0.4 12/18/2022   Lab Results  Component Value Date   VD25OH 51.0 12/18/2022     Review of Systems  Constitutional:  Negative for chills and fatigue.  Respiratory:  Negative for chest tightness and shortness of breath.   Cardiovascular:  Negative for chest pain.  Gastrointestinal:  Negative for abdominal pain.  Genitourinary:  Positive for flank pain (left sided). Negative for frequency, hematuria and urgency.    Patient Active Problem List   Diagnosis Date Noted   Vitamin D deficiency 03/27/2021   Pre-diabetes 11/07/2018   Mixed hyperlipidemia  11/05/2018   Hematuria, undiagnosed cause 05/30/2016   Environmental and seasonal allergies 02/08/2016   Essential hypertension 02/08/2016   Family history of colon cancer in mother 08/06/2015   Dysmenorrhea 08/16/2007    Allergies  Allergen Reactions   Ceftin [Cefuroxime] Diarrhea   Bactrim [Sulfamethoxazole-Trimethoprim] Itching   Betadine [Povidone Iodine] Itching   Ciprofloxacin Hives   Neomycin-Bacitracin Zn-Polymyx Itching         Past Surgical History:  Procedure Laterality Date   CHOLECYSTECTOMY  2007   COLONOSCOPY  07/2015   COLONOSCOPY WITH PROPOFOL N/A 08/20/2020   Procedure: COLONOSCOPY WITH PROPOFOL;  Surgeon: Lucilla Lame, MD;  Location: [facility];  Service: Endoscopy;  Laterality: N/A;  priority 4   GANGLION CYST EXCISION     Cyst    Social History   Tobacco Use   Smoking status: Former    Packs/day: 1.00    Years: 12.00    Additional pack years: 0.00    Total pack years: 12.00    Types: Cigarettes    Quit date: 07/18/2005    Years since quitting: 17.5   Smokeless tobacco: Never  Vaping Use   Vaping Use: Never used  Substance Use Topics   Alcohol use: No    Alcohol/week: 0.0 standard drinks of alcohol   Drug use: No  Medication list has been reviewed and updated.  Current Meds  Medication Sig   amLODipine (NORVASC) 10 MG tablet Take 1 tablet by mouth daily.   Cholecalciferol (VITAMIN D3) 50 MCG (2000 UT) TABS Take by mouth daily.   loratadine (CLARITIN) 10 MG tablet Take 10 mg by mouth daily.   metFORMIN (GLUCOPHAGE-XR) 500 MG 24 hr tablet Take 1 tablet (500 mg total) by mouth daily with breakfast.   Multiple Vitamin (MULTIVITAMIN) tablet Take 1 tablet by mouth daily.   [DISCONTINUED] benzonatate (TESSALON) 100 MG capsule Take 1 capsule (100 mg total) by mouth 2 (two) times daily as needed for cough.       02/16/2023   10:09 AM 12/18/2022    9:40 AM 07/08/2022    2:00 PM 03/10/2022   11:32 AM  GAD 7 : Generalized Anxiety Score   Nervous, Anxious, on Edge 0 0 0 0  Control/stop worrying 0 0 0 0  Worry too much - different things 0 0 0 0  Trouble relaxing 0 0 0 0  Restless 0 0 0 0  Easily annoyed or irritable 0 0 0 0  Afraid - awful might happen 0 0 0 0  Total GAD 7 Score 0 0 0 0  Anxiety Difficulty Not difficult at all Not difficult at all Not difficult at all Not difficult at all       02/16/2023   10:09 AM 12/18/2022    9:40 AM 07/08/2022    2:00 PM  Depression screen PHQ 2/9  Decreased Interest 0 0 0  Down, Depressed, Hopeless 0 0 0  PHQ - 2 Score 0 0 0  Altered sleeping 0 0 0  Tired, decreased energy 0 0 0  Change in appetite 0 0 0  Feeling bad or failure about yourself  0 0 0  Trouble concentrating 0 0 0  Moving slowly or fidgety/restless 0 0 0  Suicidal thoughts 0 0 0  PHQ-9 Score 0 0 0  Difficult doing work/chores Not difficult at all Not difficult at all Not difficult at all    BP Readings from Last 3 Encounters:  02/16/23 134/80  12/18/22 124/78  07/08/22 132/74    Physical Exam Vitals and nursing note reviewed.  Constitutional:      Appearance: Normal appearance. She is well-developed.  Cardiovascular:     Rate and Rhythm: Normal rate and regular rhythm.     Heart sounds: Normal heart sounds.  Pulmonary:     Effort: Pulmonary effort is normal. No respiratory distress.     Breath sounds: Normal breath sounds.  Abdominal:     Tenderness: There is no abdominal tenderness. There is no right CVA tenderness, left CVA tenderness, guarding or rebound.  Musculoskeletal:        General: Tenderness (under left shoulder blade above the CVA) present. Normal range of motion.  Neurological:     Mental Status: She is alert.     Wt Readings from Last 3 Encounters:  02/16/23 283 lb (128.4 kg)  12/18/22 290 lb 12.8 oz (131.9 kg)  07/08/22 291 lb (132 kg)    BP 134/80   Pulse (!) 116   Ht 5\' 8"  (1.727 m)   Wt 283 lb (128.4 kg)   SpO2 97%   BMI 43.03 kg/m   Assessment and  Plan:  Problem List Items Addressed This Visit   None Visit Diagnoses     UTI symptoms    -  Primary   Doubt renal stone since no blood  in UA push fluids, will send for UCx tylenol and heat for back pain   Relevant Orders   POCT Urinalysis Dipstick (Completed)   Urine Culture       No follow-ups on file.   Partially dictated using [place], any errors are not intentional.  Glean Hess, MD [place], [place]

## 2023-02-18 ENCOUNTER — Other Ambulatory Visit: Payer: Self-pay | Admitting: Internal Medicine

## 2023-02-18 DIAGNOSIS — N3000 Acute cystitis without hematuria: Secondary | ICD-10-CM

## 2023-02-18 LAB — URINE CULTURE

## 2023-02-18 MED ORDER — AMOXICILLIN 875 MG PO TABS: 875.0000 mg | ORAL_TABLET | Freq: Two times a day (BID) | ORAL | 0 refills | Status: AC

## 2023-03-04 ENCOUNTER — Encounter: Payer: Self-pay | Admitting: Internal Medicine

## 2023-04-01 ENCOUNTER — Encounter: Payer: Self-pay | Admitting: Internal Medicine

## 2023-04-01 ENCOUNTER — Ambulatory Visit: Payer: BC Managed Care – PPO | Admitting: Internal Medicine

## 2023-04-01 VITALS — BP 128/74 | HR 110 | Temp 97.9°F | Ht 68.0 in | Wt 280.0 lb

## 2023-04-01 DIAGNOSIS — R300 Dysuria: Secondary | ICD-10-CM | POA: Diagnosis not present

## 2023-04-01 LAB — POCT URINALYSIS DIPSTICK
Bilirubin, UA: NEGATIVE
Blood, UA: NEGATIVE
Glucose, UA: NEGATIVE
Ketones, UA: NEGATIVE
Leukocytes, UA: NEGATIVE
Nitrite, UA: NEGATIVE
Protein, UA: NEGATIVE
Spec Grav, UA: 1.015 (ref 1.010–1.025)
Urobilinogen, UA: 0.2 E.U./dL
pH, UA: 6 (ref 5.0–8.0)

## 2023-04-01 MED ORDER — NITROFURANTOIN MONOHYD MACRO 100 MG PO CAPS: 100.0000 mg | ORAL_CAPSULE | Freq: Two times a day (BID) | ORAL | 0 refills | Status: AC

## 2023-04-01 NOTE — Progress Notes (Signed)
Date:  04/01/2023   Name:  Deanna Potts   DOB:  04-24-1976   MRN:  [id number]   Chief Complaint: Urinary Tract Infection  Urinary Tract Infection  This is a new problem. The current episode started yesterday. The problem has been waxing and waning. There has been no fever. Associated symptoms include flank pain, hesitancy, sweats and urgency. Pertinent negatives include no chills or hematuria.    Lab Results  Component Value Date   NA 140 12/18/2022   K 4.4 12/18/2022   CO2 22 12/18/2022   GLUCOSE 99 12/18/2022   BUN 8 12/18/2022   CREATININE 0.76 12/18/2022   CALCIUM 9.5 12/18/2022   EGFR 97 12/18/2022   GFRNONAA 94 11/16/2019   Lab Results  Component Value Date   CHOL 203 (H) 12/18/2022   HDL 36 (L) 12/18/2022   LDLCALC 140 (H) 12/18/2022   LDLDIRECT 134.7 08/26/2007   TRIG 149 12/18/2022   CHOLHDL 5.6 (H) 12/18/2022   Lab Results  Component Value Date   TSH 0.794 12/18/2022   Lab Results  Component Value Date   HGBA1C 6.6 (H) 12/18/2022   Lab Results  Component Value Date   WBC 8.8 12/18/2022   HGB 13.5 12/18/2022   HCT 41.4 12/18/2022   MCV 84 12/18/2022   PLT 321 12/18/2022   Lab Results  Component Value Date   ALT 63 (H) 12/18/2022   AST 65 (H) 12/18/2022   ALKPHOS 85 12/18/2022   BILITOT 0.4 12/18/2022   Lab Results  Component Value Date   VD25OH 51.0 12/18/2022     Review of Systems  Constitutional:  Positive for diaphoresis. Negative for chills and fever.  Gastrointestinal:  Negative for anal bleeding and constipation.  Endocrine: Negative for polydipsia and polyuria.  Genitourinary:  Positive for flank pain, hesitancy and urgency. Negative for hematuria.  Neurological:  Negative for dizziness and headaches.  Psychiatric/Behavioral:  Negative for dysphoric mood and sleep disturbance. The patient is not nervous/anxious.     Patient Active Problem List   Diagnosis Date Noted   Vitamin D deficiency 03/27/2021   Pre-diabetes  11/07/2018   Mixed hyperlipidemia 11/05/2018   Hematuria, undiagnosed cause 05/30/2016   Environmental and seasonal allergies 02/08/2016   Essential hypertension 02/08/2016   Family history of colon cancer in mother 08/06/2015   Dysmenorrhea 08/16/2007    Allergies  Allergen Reactions   Ceftin [Cefuroxime] Diarrhea   Bactrim [Sulfamethoxazole-Trimethoprim] Itching   Betadine [Povidone Iodine] Itching   Ciprofloxacin Hives   Neomycin-Bacitracin Zn-Polymyx Itching         Past Surgical History:  Procedure Laterality Date   CHOLECYSTECTOMY  2007   COLONOSCOPY  07/2015   COLONOSCOPY WITH PROPOFOL N/A 08/20/2020   Procedure: COLONOSCOPY WITH PROPOFOL;  Surgeon: Midge Minium, MD;  Location: [facility];  Service: Endoscopy;  Laterality: N/A;  priority 4   GANGLION CYST EXCISION     Cyst    Social History   Tobacco Use   Smoking status: Former    Packs/day: 1.00    Years: 12.00    Additional pack years: 0.00    Total pack years: 12.00    Types: Cigarettes    Quit date: 07/18/2005    Years since quitting: 17.7   Smokeless tobacco: Never  Vaping Use   Vaping Use: Never used  Substance Use Topics   Alcohol use: No    Alcohol/week: 0.0 standard drinks of alcohol   Drug use: No     Medication list  has been reviewed and updated.  Current Meds  Medication Sig   amLODipine (NORVASC) 10 MG tablet Take 1 tablet by mouth daily.   Cholecalciferol (VITAMIN D3) 50 MCG (2000 UT) TABS Take by mouth daily.   loratadine (CLARITIN) 10 MG tablet Take 10 mg by mouth daily.   metFORMIN (GLUCOPHAGE-XR) 500 MG 24 hr tablet Take 1 tablet (500 mg total) by mouth daily with breakfast.   Multiple Vitamin (MULTIVITAMIN) tablet Take 1 tablet by mouth daily.   nitrofurantoin, macrocrystal-monohydrate, (MACROBID) 100 MG capsule Take 1 capsule (100 mg total) by mouth 2 (two) times daily for 3 days.       04/01/2023    1:31 PM 02/16/2023   10:09 AM 12/18/2022    9:40 AM 07/08/2022    2:00 PM   GAD 7 : Generalized Anxiety Score  Nervous, Anxious, on Edge 0 0 0 0  Control/stop worrying 0 0 0 0  Worry too much - different things 0 0 0 0  Trouble relaxing 0 0 0 0  Restless 0 0 0 0  Easily annoyed or irritable 0 0 0 0  Afraid - awful might happen 0 0 0 0  Total GAD 7 Score 0 0 0 0  Anxiety Difficulty Not difficult at all Not difficult at all Not difficult at all Not difficult at all       04/01/2023    1:31 PM 02/16/2023   10:09 AM 12/18/2022    9:40 AM  Depression screen PHQ 2/9  Decreased Interest 0 0 0  Down, Depressed, Hopeless 0 0 0  PHQ - 2 Score 0 0 0  Altered sleeping 0 0 0  Tired, decreased energy 0 0 0  Change in appetite 0 0 0  Feeling bad or failure about yourself  0 0 0  Trouble concentrating 0 0 0  Moving slowly or fidgety/restless 0 0 0  Suicidal thoughts 0 0 0  PHQ-9 Score 0 0 0  Difficult doing work/chores Not difficult at all Not difficult at all Not difficult at all    BP Readings from Last 3 Encounters:  04/01/23 128/74  02/16/23 134/80  12/18/22 124/78    Physical Exam Vitals and nursing note reviewed.  Constitutional:      General: She is not in acute distress.    Appearance: Normal appearance. She is well-developed.  HENT:     Head: Normocephalic and atraumatic.  Cardiovascular:     Rate and Rhythm: Normal rate and regular rhythm.  Pulmonary:     Effort: Pulmonary effort is normal. No respiratory distress.     Breath sounds: No wheezing or rhonchi.  Abdominal:     Palpations: Abdomen is soft.     Tenderness: There is no abdominal tenderness. There is no right CVA tenderness or left CVA tenderness.  Skin:    General: Skin is warm and dry.     Findings: No rash.  Neurological:     Mental Status: She is alert and oriented to person, place, and time.  Psychiatric:        Mood and Affect: Mood normal.        Behavior: Behavior normal.   Urine dipstick shows negative for all components.  Micro exam: not done.   Wt Readings from Last  3 Encounters:  04/01/23 280 lb (127 kg)  02/16/23 283 lb (128.4 kg)  12/18/22 290 lb 12.8 oz (131.9 kg)    BP 128/74   Pulse (!) 110   Temp 97.9 F (36.6  C) (Oral)   Ht 5\' 8"  (1.727 m)   Wt 280 lb (127 kg)   SpO2 97%   BMI 42.57 kg/m   Assessment and Plan:  Problem List Items Addressed This Visit   None Visit Diagnoses     Dysuria    -  Primary   hx of recurrent UTI sx - recently treated for strept with Amox UA negative today. will treat empirically.   Relevant Medications   nitrofurantoin, macrocrystal-monohydrate, (MACROBID) 100 MG capsule   Other Relevant Orders   POCT urinalysis dipstick (Completed)       No follow-ups on file.   Partially dictated using Dragon software, any errors are not intentional.  Reubin Milan, MD [facility] [place], [place]

## 2023-04-23 ENCOUNTER — Ambulatory Visit: Payer: BC Managed Care – PPO | Admitting: Internal Medicine

## 2023-04-23 ENCOUNTER — Encounter: Payer: Self-pay | Admitting: Internal Medicine

## 2023-04-23 VITALS — BP 128/76 | HR 65 | Ht 68.0 in | Wt 277.6 lb

## 2023-04-23 DIAGNOSIS — R7303 Prediabetes: Secondary | ICD-10-CM

## 2023-04-23 DIAGNOSIS — I10 Essential (primary) hypertension: Secondary | ICD-10-CM

## 2023-04-23 DIAGNOSIS — E782 Mixed hyperlipidemia: Secondary | ICD-10-CM | POA: Diagnosis not present

## 2023-04-23 NOTE — Assessment & Plan Note (Signed)
Stable exam with well controlled BP.  Currently taking amlodipine. Tolerating medications without concerns or side effects. Will continue to recommend low sodium diet and current regimen.  

## 2023-04-23 NOTE — Progress Notes (Signed)
Date:  04/23/2023   Name:  Deanna Potts   DOB:  [birth year]   MRN:  [id number]   Chief Complaint: Diabetes  Diabetes She presents for her follow-up diabetic visit. Diabetes type: prediabetes with A1C 6.6 last visit. Pertinent negatives for hypoglycemia include no headaches or tremors. Pertinent negatives for diabetes include no chest pain, no fatigue, no polydipsia and no polyuria.    Lab Results  Component Value Date   NA 140 12/18/2022   K 4.4 12/18/2022   CO2 22 12/18/2022   GLUCOSE 99 12/18/2022   BUN 8 12/18/2022   CREATININE 0.76 12/18/2022   CALCIUM 9.5 12/18/2022   EGFR 97 12/18/2022   GFRNONAA 94 11/16/2019   Lab Results  Component Value Date   CHOL 203 (H) 12/18/2022   HDL 36 (L) 12/18/2022   LDLCALC 140 (H) 12/18/2022   LDLDIRECT 134.7 08/26/2007   TRIG 149 12/18/2022   CHOLHDL 5.6 (H) 12/18/2022   Lab Results  Component Value Date   TSH 0.794 12/18/2022   Lab Results  Component Value Date   HGBA1C 6.6 (H) 12/18/2022   Lab Results  Component Value Date   WBC 8.8 12/18/2022   HGB 13.5 12/18/2022   HCT 41.4 12/18/2022   MCV 84 12/18/2022   PLT 321 12/18/2022   Lab Results  Component Value Date   ALT 63 (H) 12/18/2022   AST 65 (H) 12/18/2022   ALKPHOS 85 12/18/2022   BILITOT 0.4 12/18/2022   Lab Results  Component Value Date   VD25OH 51.0 12/18/2022     Review of Systems  Constitutional:  Positive for unexpected weight change (has lost 6 lbs with diet changes). Negative for appetite change, fatigue and fever.  HENT:  Negative for tinnitus and trouble swallowing.   Eyes:  Negative for visual disturbance.  Respiratory:  Negative for cough, chest tightness and shortness of breath.   Cardiovascular:  Negative for chest pain, palpitations and leg swelling.  Gastrointestinal:  Negative for abdominal pain.  Endocrine: Negative for polydipsia and polyuria.  Genitourinary:  Negative for dysuria and hematuria.  Musculoskeletal:  Negative for  arthralgias.  Neurological:  Negative for tremors, numbness and headaches.  Psychiatric/Behavioral:  Negative for dysphoric mood.     Patient Active Problem List   Diagnosis Date Noted   Vitamin D deficiency 03/27/2021   Pre-diabetes 11/07/2018   Mixed hyperlipidemia 11/05/2018   Hematuria, undiagnosed cause 05/30/2016   Environmental and seasonal allergies 02/08/2016   Essential hypertension 02/08/2016   Family history of colon cancer in mother 08/06/2015   Dysmenorrhea 08/16/2007    Allergies  Allergen Reactions   Ceftin [Cefuroxime] Diarrhea   Bactrim [Sulfamethoxazole-Trimethoprim] Itching   Betadine [Povidone Iodine] Itching   Ciprofloxacin Hives   Neomycin-Bacitracin Zn-Polymyx Itching         Past Surgical History:  Procedure Laterality Date   CHOLECYSTECTOMY  2007   COLONOSCOPY  07/2015   COLONOSCOPY WITH PROPOFOL N/A 08/20/2020   Procedure: COLONOSCOPY WITH PROPOFOL;  Surgeon: Midge Minium, MD;  Location: [facility];  Service: Endoscopy;  Laterality: N/A;  priority 4   GANGLION CYST EXCISION     Cyst    Social History   Tobacco Use   Smoking status: Former    Packs/day: 1.00    Years: 12.00    Additional pack years: 0.00    Total pack years: 12.00    Types: Cigarettes    Quit date: 07/18/2005    Years since quitting: 17.7   Smokeless  tobacco: Never  Vaping Use   Vaping Use: Never used  Substance Use Topics   Alcohol use: No    Alcohol/week: 0.0 standard drinks of alcohol   Drug use: No     Medication list has been reviewed and updated.  Current Meds  Medication Sig   amLODipine (NORVASC) 10 MG tablet Take 1 tablet by mouth daily.   Cholecalciferol (VITAMIN D3) 50 MCG (2000 UT) TABS Take by mouth daily.   loratadine (CLARITIN) 10 MG tablet Take 10 mg by mouth daily.   metFORMIN (GLUCOPHAGE-XR) 500 MG 24 hr tablet Take 1 tablet (500 mg total) by mouth daily with breakfast.   Multiple Vitamin (MULTIVITAMIN) tablet Take 1 tablet by mouth  daily.       04/23/2023    8:50 AM 04/01/2023    1:31 PM 02/16/2023   10:09 AM 12/18/2022    9:40 AM  GAD 7 : Generalized Anxiety Score  Nervous, Anxious, on Edge 0 0 0 0  Control/stop worrying 0 0 0 0  Worry too much - different things 0 0 0 0  Trouble relaxing 0 0 0 0  Restless 0 0 0 0  Easily annoyed or irritable 0 0 0 0  Afraid - awful might happen 0 0 0 0  Total GAD 7 Score 0 0 0 0  Anxiety Difficulty Not difficult at all Not difficult at all Not difficult at all Not difficult at all       04/23/2023    8:50 AM 04/01/2023    1:31 PM 02/16/2023   10:09 AM  Depression screen PHQ 2/9  Decreased Interest 0 0 0  Down, Depressed, Hopeless 0 0 0  PHQ - 2 Score 0 0 0  Altered sleeping 0 0 0  Tired, decreased energy 0 0 0  Change in appetite 0 0 0  Feeling bad or failure about yourself  0 0 0  Trouble concentrating 0 0 0  Moving slowly or fidgety/restless 0 0 0  Suicidal thoughts 0 0 0  PHQ-9 Score 0 0 0  Difficult doing work/chores Not difficult at all Not difficult at all Not difficult at all    BP Readings from Last 3 Encounters:  04/23/23 128/76  04/01/23 128/74  02/16/23 134/80    Physical Exam Vitals and nursing note reviewed.  Constitutional:      General: She is not in acute distress.    Appearance: She is well-developed.  HENT:     Head: Normocephalic and atraumatic.  Cardiovascular:     Rate and Rhythm: Normal rate and regular rhythm.  Pulmonary:     Effort: Pulmonary effort is normal. No respiratory distress.     Breath sounds: No wheezing or rhonchi.  Musculoskeletal:     Cervical back: Normal range of motion.  Skin:    General: Skin is warm and dry.     Findings: No rash.  Neurological:     General: No focal deficit present.     Mental Status: She is alert and oriented to person, place, and time.  Psychiatric:        Mood and Affect: Mood normal.        Behavior: Behavior normal.    Diabetic Foot Exam - Simple   Simple Foot Form Diabetic Foot  exam was performed with the following findings: Yes 04/23/2023  9:03 AM  Visual Inspection No deformities, no ulcerations, no other skin breakdown bilaterally: Yes Sensation Testing Intact to touch and monofilament testing bilaterally: Yes Pulse Check  Posterior Tibialis and Dorsalis pulse intact bilaterally: Yes Comments      Wt Readings from Last 3 Encounters:  04/23/23 277 lb 9.6 oz (125.9 kg)  04/01/23 280 lb (127 kg)  02/16/23 283 lb (128.4 kg)    BP 128/76 (BP Location: Right Arm, Cuff Size: Large)   Pulse 65   Ht 5\' 8"  (1.727 m)   Wt 277 lb 9.6 oz (125.9 kg)   SpO2 98%   BMI 42.21 kg/m   Assessment and Plan:  Problem List Items Addressed This Visit     Pre-diabetes - Primary (Chronic)    Blood sugars stable without hypoglycemic symptoms or events. Currently being treated with metformin since A1C increased to 6.6 last visit. Diet changes and weight loss also encouraged - she has done well with a 6 lb weight loss. Lab Results  Component Value Date   HGBA1C 6.6 (H) 12/18/2022        Relevant Orders   Basic metabolic panel   Hemoglobin A1c   Microalbumin / creatinine urine ratio   Mixed hyperlipidemia (Chronic)    Cholesterol is elevated with LDL 140 Not on a statin. Will continue lifestyle changes for now and recheck next visit      Essential hypertension (Chronic)    Stable exam with well controlled BP.  Currently taking amlodipine. Tolerating medications without concerns or side effects. Will continue to recommend low sodium diet and current regimen.        No follow-ups on file.   Partially dictated using Dragon software, any errors are not intentional.  Reubin Milan, MD [facility] [place], [place]

## 2023-04-23 NOTE — Assessment & Plan Note (Addendum)
Blood sugars stable without hypoglycemic symptoms or events. Currently being treated with metformin since A1C increased to 6.6 last visit. Diet changes and weight loss also encouraged - she has done well with a 6 lb weight loss. Lab Results  Component Value Date   HGBA1C 6.6 (H) 12/18/2022

## 2023-04-23 NOTE — Assessment & Plan Note (Addendum)
Cholesterol is elevated with LDL 140 Not on a statin. Will continue lifestyle changes for now and recheck next visit

## 2023-04-26 LAB — MICROALBUMIN / CREATININE URINE RATIO
Creatinine, Urine: 226 mg/dL
Microalb/Creat Ratio: 15 mg/g creat (ref 0–29)
Microalbumin, Urine: 33.8 ug/mL

## 2023-04-26 LAB — BASIC METABOLIC PANEL
BUN/Creatinine Ratio: 14 (ref 9–23)
BUN: 10 mg/dL (ref 6–24)
CO2: 24 mmol/L (ref 20–29)
Calcium: 9.8 mg/dL (ref 8.7–10.2)
Chloride: 98 mmol/L (ref 96–106)
Creatinine, Ser: 0.73 mg/dL (ref 0.57–1.00)
Glucose: 101 mg/dL — ABNORMAL HIGH (ref 70–99)
Potassium: 4.3 mmol/L (ref 3.5–5.2)
Sodium: 138 mmol/L (ref 134–144)
eGFR: 101 mL/min/{1.73_m2} (ref >59)

## 2023-04-26 LAB — HEMOGLOBIN A1C
Est. average glucose Bld gHb Est-mCnc: 128 mg/dL
Hgb A1c MFr Bld: 6.1 % — ABNORMAL HIGH (ref 4.8–5.6)

## 2023-04-29 ENCOUNTER — Other Ambulatory Visit: Payer: Self-pay | Admitting: Internal Medicine

## 2023-04-29 DIAGNOSIS — E118 Type 2 diabetes mellitus with unspecified complications: Secondary | ICD-10-CM

## 2023-04-30 NOTE — Telephone Encounter (Signed)
Unable to refill per protocol, Rx request is too soon. Last refill 12/19/22 for 90 and 1 refill.  Requested Prescriptions  Pending Prescriptions Disp Refills   metFORMIN (GLUCOPHAGE-XR) 500 MG 24 hr tablet [Pharmacy Med Name: Metformin Hydrochloride 500mg  Extended-Release Tablet] 90 tablet 0    Sig: Take 1 tablet by mouth daily with breakfast.     Endocrinology:  Diabetes - Biguanides Failed - 04/29/2023  3:58 PM      Failed - B12 Level in normal range and within 720 days    No results found for: "VITAMINB12"       Passed - Cr in normal range and within 360 days    Creatinine, Ser  Date Value Ref Range Status  04/23/2023 0.73 0.57 - 1.00 mg/dL Final         Passed - HBA1C is between 0 and 7.9 and within 180 days    Hgb A1c MFr Bld  Date Value Ref Range Status  04/23/2023 6.1 (H) 4.8 - 5.6 % Final    Comment:             Prediabetes: 5.7 - 6.4          Diabetes: >6.4          Glycemic control for adults with diabetes: <7.0          Passed - eGFR in normal range and within 360 days    GFR calc Af Amer  Date Value Ref Range Status  11/16/2019 109 >59 mL/min/1.73 Final   GFR calc non Af Amer  Date Value Ref Range Status  11/16/2019 94 >59 mL/min/1.73 Final   eGFR  Date Value Ref Range Status  04/23/2023 101 >59 mL/min/1.73 Final         Passed - Valid encounter within last 6 months    Recent Outpatient Visits           1 week ago Pre-diabetes   [place] [facility] at [facility], Nyoka Cowden, MD   4 weeks ago Dysuria   [facility] at [facility], Nyoka Cowden, MD   2 months ago UTI symptoms   [facility] at [facility], Nyoka Cowden, MD   4 months ago Annual physical exam   [facility] Medicine at [facility], Nyoka Cowden, MD   9 months ago Acute cystitis with hematuria   [facility] Primary Care & Sports Medicine at  [facility], Nyoka Cowden, MD       Future Appointments             In 2 months Judithann Graves Nyoka Cowden, MD [facility] Primary Care & [facility] Medicine at [facility], [facility]   In 7 months Reubin Milan, MD [facility] Primary Care & Sports Medicine at [facility], [facility]            Passed - CBC within normal limits and completed in the last 12 months    WBC  Date Value Ref Range Status  12/18/2022 8.8 3.4 - 10.8 x10E3/uL Final  08/27/2009 16.5 (H) 4.0 - 10.5 K/uL Final   RBC  Date Value Ref Range Status  12/18/2022 4.95 3.77 - 5.28 x10E6/uL Final  03/10/2022 5 4.04 - 5.48 M/uL Final  08/27/2009 3.00 (L) 3.87 - 5.11 MIL/uL Final   Hemoglobin  Date Value Ref Range Status  12/18/2022 13.5 11.1 - 15.9 g/dL Final  Hematocrit  Date Value Ref Range Status  12/18/2022 41.4 34.0 - 46.6 % Final   MCHC  Date Value Ref Range Status  12/18/2022 32.6 31.5 - 35.7 g/dL Final  16/08/9603 54.0 30.0 - 36.0 g/dL Final   [facility]  Date Value Ref Range Status  12/18/2022 27.3 26.6 - 33.0 pg Final   MCV  Date Value Ref Range Status  12/18/2022 84 79 - 97 fL Final   No results found for: "PLTCOUNTKUC", "LABPLAT", "POCPLA" RDW  Date Value Ref Range Status  12/18/2022 14.3 11.7 - 15.4 % Final

## 2023-05-06 ENCOUNTER — Other Ambulatory Visit: Payer: Self-pay | Admitting: Internal Medicine

## 2023-05-06 DIAGNOSIS — I10 Essential (primary) hypertension: Secondary | ICD-10-CM

## 2023-05-19 ENCOUNTER — Encounter: Payer: Self-pay | Admitting: Internal Medicine

## 2023-06-10 ENCOUNTER — Telehealth: Payer: BC Managed Care – PPO | Admitting: Family Medicine

## 2023-06-10 DIAGNOSIS — R3989 Other symptoms and signs involving the genitourinary system: Secondary | ICD-10-CM | POA: Diagnosis not present

## 2023-06-10 MED ORDER — CEPHALEXIN 500 MG PO CAPS
500.0000 mg | ORAL_CAPSULE | Freq: Two times a day (BID) | ORAL | 0 refills | Status: AC
Start: 2023-06-10 — End: 2023-06-17

## 2023-06-10 NOTE — Progress Notes (Signed)

## 2023-06-15 ENCOUNTER — Encounter: Payer: Self-pay | Admitting: Internal Medicine

## 2023-06-15 ENCOUNTER — Other Ambulatory Visit: Payer: Self-pay

## 2023-06-15 DIAGNOSIS — E118 Type 2 diabetes mellitus with unspecified complications: Secondary | ICD-10-CM

## 2023-06-15 MED ORDER — METFORMIN HCL ER 500 MG PO TB24
500.0000 mg | ORAL_TABLET | Freq: Every day | ORAL | 1 refills | Status: DC
Start: 2023-06-15 — End: 2023-12-02

## 2023-06-25 ENCOUNTER — Other Ambulatory Visit: Payer: Self-pay

## 2023-06-30 ENCOUNTER — Ambulatory Visit: Payer: BC Managed Care – PPO | Admitting: Internal Medicine

## 2023-08-23 ENCOUNTER — Telehealth: Payer: BC Managed Care – PPO | Admitting: Physician Assistant

## 2023-08-23 DIAGNOSIS — R3989 Other symptoms and signs involving the genitourinary system: Secondary | ICD-10-CM | POA: Diagnosis not present

## 2023-08-23 MED ORDER — NITROFURANTOIN MONOHYD MACRO 100 MG PO CAPS
100.0000 mg | ORAL_CAPSULE | Freq: Two times a day (BID) | ORAL | 0 refills | Status: DC
Start: 2023-08-23 — End: 2023-08-31

## 2023-08-23 NOTE — Progress Notes (Signed)
E-Visit for Urinary Problems  We are sorry that you are not feeling well.  Here is how we plan to help!  Based on what you shared with me it looks like you most likely have a simple urinary tract infection.  A UTI (Urinary Tract Infection) is a bacterial infection of the bladder.  Most cases of urinary tract infections are simple to treat but a key part of your care is to encourage you to drink plenty of fluids and watch your symptoms carefully.  I have prescribed MacroBid 100 mg twice a day for 5 days.  Your symptoms should gradually improve. Call us if the burning in your urine worsens, you develop worsening fever, back pain or pelvic pain or if your symptoms do not resolve after completing the antibiotic.  Urinary tract infections can be prevented by drinking plenty of water to keep your body hydrated.  Also be sure when you wipe, wipe from front to back and don't hold it in!  If possible, empty your bladder every 4 hours.  HOME CARE Drink plenty of fluids Compete the full course of the antibiotics even if the symptoms resolve Remember, when you need to go.go. Holding in your urine can increase the likelihood of getting a UTI! GET HELP RIGHT AWAY IF: You cannot urinate You get a high fever Worsening back pain occurs You see blood in your urine You feel sick to your stomach or throw up You feel like you are going to pass out  MAKE SURE YOU  Understand these instructions. Will watch your condition. Will get help right away if you are not doing well or get worse.   Thank you for choosing an e-visit.  Your e-visit answers were reviewed by a board certified advanced clinical practitioner to complete your personal care plan. Depending upon the condition, your plan could have included both over the counter or prescription medications.  Please review your pharmacy choice. Make sure the pharmacy is open so you can pick up prescription now. If there is a problem, you may contact your  provider through MyChart messaging and have the prescription routed to another pharmacy.  Your safety is important to us. If you have drug allergies check your prescription carefully.   For the next 24 hours you can use MyChart to ask questions about today's visit, request a non-urgent call back, or ask for a work or school excuse. You will get an email in the next two days asking about your experience. I hope that your e-visit has been valuable and will speed your recovery.  I have spent 5 minutes in review of e-visit questionnaire, review and updating patient chart, medical decision making and response to patient.   Yavier Snider M Heberto Sturdevant, PA-C  

## 2023-08-31 ENCOUNTER — Ambulatory Visit: Payer: BC Managed Care – PPO | Admitting: Internal Medicine

## 2023-08-31 VITALS — BP 118/72 | HR 85 | Ht 68.0 in | Wt 265.0 lb

## 2023-08-31 DIAGNOSIS — R21 Rash and other nonspecific skin eruption: Secondary | ICD-10-CM

## 2023-08-31 DIAGNOSIS — Z23 Encounter for immunization: Secondary | ICD-10-CM | POA: Diagnosis not present

## 2023-08-31 DIAGNOSIS — I10 Essential (primary) hypertension: Secondary | ICD-10-CM | POA: Diagnosis not present

## 2023-08-31 MED ORDER — AMLODIPINE BESYLATE 10 MG PO TABS: 10.0000 mg | ORAL_TABLET | Freq: Every day | ORAL | 1 refills | Status: DC

## 2023-08-31 NOTE — Progress Notes (Signed)
Date:  08/31/2023   Name:  Deanna Potts   DOB:  Jan 31, 1976   MRN:  [id number]   Chief Complaint: Hypertension  Hypertension This is a chronic problem. The problem is controlled. Pertinent negatives include no chest pain, headaches, palpitations or shortness of breath. Past treatments include calcium channel blockers. The current treatment provides significant improvement. There is no history of kidney disease, CAD/MI or CVA.    Review of Systems  Constitutional:  Positive for unexpected weight change (she is losing weight with diet and exercise). Negative for chills, fatigue and fever.  HENT:  Negative for trouble swallowing.   Respiratory:  Negative for chest tightness and shortness of breath.   Cardiovascular:  Negative for chest pain and palpitations.  Neurological:  Negative for dizziness, light-headedness and headaches.  Psychiatric/Behavioral:  Negative for dysphoric mood. The patient is not nervous/anxious.      Lab Results  Component Value Date   NA 138 04/23/2023   K 4.3 04/23/2023   CO2 24 04/23/2023   GLUCOSE 101 (H) 04/23/2023   BUN 10 04/23/2023   CREATININE 0.73 04/23/2023   CALCIUM 9.8 04/23/2023   EGFR 101 04/23/2023   GFRNONAA 94 11/16/2019   Lab Results  Component Value Date   CHOL 203 (H) 12/18/2022   HDL 36 (L) 12/18/2022   LDLCALC 140 (H) 12/18/2022   LDLDIRECT 134.7 08/26/2007   TRIG 149 12/18/2022   CHOLHDL 5.6 (H) 12/18/2022   Lab Results  Component Value Date   TSH 0.794 12/18/2022   Lab Results  Component Value Date   HGBA1C 6.1 (H) 04/23/2023   Lab Results  Component Value Date   WBC 8.8 12/18/2022   HGB 13.5 12/18/2022   HCT 41.4 12/18/2022   MCV 84 12/18/2022   PLT 321 12/18/2022   Lab Results  Component Value Date   ALT 63 (H) 12/18/2022   AST 65 (H) 12/18/2022   ALKPHOS 85 12/18/2022   BILITOT 0.4 12/18/2022   Lab Results  Component Value Date   VD25OH 51.0 12/18/2022     Patient Active Problem List    Diagnosis Date Noted   Vitamin D deficiency 03/27/2021   Pre-diabetes 11/07/2018   Mixed hyperlipidemia 11/05/2018   Hematuria, undiagnosed cause 05/30/2016   Environmental and seasonal allergies 02/08/2016   Essential hypertension 02/08/2016   Family history of colon cancer in mother 08/06/2015   Dysmenorrhea 08/16/2007    Allergies  Allergen Reactions   Ceftin [Cefuroxime] Diarrhea   Bactrim [Sulfamethoxazole-Trimethoprim] Itching   Betadine [Povidone Iodine] Itching   Ciprofloxacin Hives   Neomycin-Bacitracin Zn-Polymyx Itching         Past Surgical History:  Procedure Laterality Date   CHOLECYSTECTOMY  2007   COLONOSCOPY  07/2015   COLONOSCOPY WITH PROPOFOL N/A 08/20/2020   Procedure: COLONOSCOPY WITH PROPOFOL;  Surgeon: Midge Minium, MD;  Location: [facility];  Service: Endoscopy;  Laterality: N/A;  priority 4   GANGLION CYST EXCISION     Cyst    Social History   Tobacco Use   Smoking status: Former    Current packs/day: 0.00    Average packs/day: 1 pack/day for 12.0 years (12.0 ttl pk-yrs)    Types: Cigarettes    Start date: 07/18/1993    Quit date: 07/18/2005    Years since quitting: 18.1   Smokeless tobacco: Never  Vaping Use   Vaping status: Never Used  Substance Use Topics   Alcohol use: No    Alcohol/week: 0.0 standard drinks of  alcohol   Drug use: No     Medication list has been reviewed and updated.  Current Meds  Medication Sig   Cholecalciferol (VITAMIN D3) 50 MCG (2000 UT) TABS Take by mouth daily.   loratadine (CLARITIN) 10 MG tablet Take 10 mg by mouth daily.   metFORMIN (GLUCOPHAGE-XR) 500 MG 24 hr tablet Take 1 tablet (500 mg total) by mouth daily with breakfast.   Multiple Vitamin (MULTIVITAMIN) tablet Take 1 tablet by mouth daily.   [DISCONTINUED] amLODipine (NORVASC) 10 MG tablet Take 1 tablet by mouth daily.       08/31/2023    3:20 PM 04/23/2023    8:50 AM 04/01/2023    1:31 PM 02/16/2023   10:09 AM  GAD 7 : Generalized  Anxiety Score  Nervous, Anxious, on Edge 0 0 0 0  Control/stop worrying 0 0 0 0  Worry too much - different things 0 0 0 0  Trouble relaxing 0 0 0 0  Restless 0 0 0 0  Easily annoyed or irritable 0 0 0 0  Afraid - awful might happen 0 0 0 0  Total GAD 7 Score 0 0 0 0  Anxiety Difficulty Not difficult at all Not difficult at all Not difficult at all Not difficult at all       08/31/2023    3:20 PM 04/23/2023    8:50 AM 04/01/2023    1:31 PM  Depression screen PHQ 2/9  Decreased Interest 0 0 0  Down, Depressed, Hopeless 0 0 0  PHQ - 2 Score 0 0 0  Altered sleeping 0 0 0  Tired, decreased energy 0 0 0  Change in appetite 0 0 0  Feeling bad or failure about yourself  0 0 0  Trouble concentrating 0 0 0  Moving slowly or fidgety/restless 0 0 0  Suicidal thoughts 0 0 0  PHQ-9 Score 0 0 0  Difficult doing work/chores Not difficult at all Not difficult at all Not difficult at all    BP Readings from Last 3 Encounters:  08/31/23 118/72  04/23/23 128/76  04/01/23 128/74    Physical Exam Vitals and nursing note reviewed.  Constitutional:      General: She is not in acute distress.    Appearance: She is well-developed.  HENT:     Head: Normocephalic and atraumatic.  Neck:     Vascular: No carotid bruit.  Cardiovascular:     Rate and Rhythm: Normal rate and regular rhythm.     Pulses: Normal pulses.     Heart sounds: No murmur heard. Pulmonary:     Effort: Pulmonary effort is normal. No respiratory distress.     Breath sounds: No wheezing or rhonchi.  Musculoskeletal:     Cervical back: Normal range of motion.     Right lower leg: No edema.     Left lower leg: No edema.  Lymphadenopathy:     Cervical: No cervical adenopathy.  Skin:    General: Skin is warm and dry.     Findings: No rash.     Comments: Non specific discoloration (pink tinge) of two lesions on inner left thigh  Neurological:     General: No focal deficit present.     Mental Status: She is alert and  oriented to person, place, and time. Mental status is at baseline.  Psychiatric:        Mood and Affect: Mood normal.        Behavior: Behavior normal.  Wt Readings from Last 3 Encounters:  08/31/23 265 lb (120.2 kg)  04/23/23 277 lb 9.6 oz (125.9 kg)  04/01/23 280 lb (127 kg)    BP 118/72   Pulse 85   Ht 5\' 8"  (1.727 m)   Wt 265 lb (120.2 kg)   SpO2 97%   BMI 40.29 kg/m   Assessment and Plan:  Problem List Items Addressed This Visit       Unprioritized   Essential hypertension (Chronic)   Relevant Medications   amLODipine (NORVASC) 10 MG tablet   Other Visit Diagnoses     Rash and nonspecific skin eruption    -  Primary   uncertain cause - pt will monitor for worsening   Need for influenza vaccination       Relevant Orders   Flu vaccine trivalent PF, 6mos and older(Flulaval,Afluria,Fluarix,Fluzone) (Completed)       No follow-ups on file.    Reubin Milan, MD [facility] [place]

## 2023-10-08 ENCOUNTER — Telehealth: Payer: Self-pay | Admitting: Internal Medicine

## 2023-10-08 NOTE — Telephone Encounter (Signed)
Need mammo and pap results.  Not available in CareEverywhere.

## 2023-10-13 NOTE — Telephone Encounter (Signed)
Request sent.  KP

## 2023-10-22 ENCOUNTER — Telehealth: Payer: BC Managed Care – PPO | Admitting: Physician Assistant

## 2023-10-22 DIAGNOSIS — R3989 Other symptoms and signs involving the genitourinary system: Secondary | ICD-10-CM | POA: Diagnosis not present

## 2023-10-22 DIAGNOSIS — B3731 Acute candidiasis of vulva and vagina: Secondary | ICD-10-CM

## 2023-10-22 MED ORDER — CEPHALEXIN 500 MG PO CAPS
500.0000 mg | ORAL_CAPSULE | Freq: Two times a day (BID) | ORAL | 0 refills | Status: DC
Start: 2023-10-22 — End: 2023-12-25

## 2023-10-22 MED ORDER — FLUCONAZOLE 150 MG PO TABS
150.0000 mg | ORAL_TABLET | ORAL | 0 refills | Status: DC | PRN
Start: 2023-10-22 — End: 2023-12-25

## 2023-10-22 NOTE — Progress Notes (Signed)
E-Visit for Urinary Problems  We are sorry that you are not feeling well.  Here is how we plan to help!  Based on what you shared with me it looks like you most likely have a simple urinary tract infection.  A UTI (Urinary Tract Infection) is a bacterial infection of the bladder.  Most cases of urinary tract infections are simple to treat but a key part of your care is to encourage you to drink plenty of fluids and watch your symptoms carefully.  I have prescribed Keflex 500 mg twice a day for 7 days.  Your symptoms should gradually improve. Call us if the burning in your urine worsens, you develop worsening fever, back pain or pelvic pain or if your symptoms do not resolve after completing the antibiotic. I have also prescribed Fluconazole 150mg  Take 1 tablet every 3 days for yeast symptoms.  Urinary tract infections can be prevented by drinking plenty of water to keep your body hydrated.  Also be sure when you wipe, wipe from front to back and don't hold it in!  If possible, empty your bladder every 4 hours.  HOME CARE Drink plenty of fluids Compete the full course of the antibiotics even if the symptoms resolve Remember, when you need to go.go. Holding in your urine can increase the likelihood of getting a UTI! GET HELP RIGHT AWAY IF: You cannot urinate You get a high fever Worsening back pain occurs You see blood in your urine You feel sick to your stomach or throw up You feel like you are going to pass out  MAKE SURE YOU  Understand these instructions. Will watch your condition. Will get help right away if you are not doing well or get worse.   Thank you for choosing an e-visit.  Your e-visit answers were reviewed by a board certified advanced clinical practitioner to complete your personal care plan. Depending upon the condition, your plan could have included both over the counter or prescription medications.  Please review your pharmacy choice. Make sure the pharmacy is  open so you can pick up prescription now. If there is a problem, you may contact your provider through [employer] and have the prescription routed to another pharmacy.  Your safety is important to [place]. If you have drug allergies check your prescription carefully.   For the next 24 hours you can use MyChart to ask questions about today's visit, request a non-urgent call back, or ask for a work or school excuse. You will get an email in the next two days asking about your experience. I hope that your e-visit has been valuable and will speed your recovery.   I have spent 5 minutes in review of e-visit questionnaire, review and updating patient chart, medical decision making and response to patient.   Margaretann Loveless, PA-C

## 2023-12-02 ENCOUNTER — Other Ambulatory Visit: Payer: Self-pay | Admitting: Internal Medicine

## 2023-12-02 DIAGNOSIS — E118 Type 2 diabetes mellitus with unspecified complications: Secondary | ICD-10-CM

## 2023-12-02 NOTE — Telephone Encounter (Signed)
 Requested Prescriptions  Pending Prescriptions Disp Refills   metFORMIN  (GLUCOPHAGE -XR) 500 MG 24 hr tablet [Pharmacy Med Name: METFORMIN  HCL ER 500 MG TABLET] 90 tablet 0    Sig: TAKE 1 TABLET BY MOUTH EVERY DAY WITH BREAKFAST     Endocrinology:  Diabetes - Biguanides Failed - 12/02/2023  4:31 PM      Failed - HBA1C is between 0 and 7.9 and within 180 days    Hgb A1c MFr Bld  Date Value Ref Range Status  04/23/2023 6.1 (H) 4.8 - 5.6 % Final    Comment:             Prediabetes: 5.7 - 6.4          Diabetes: >6.4          Glycemic control for adults with diabetes: <7.0          Failed - B12 Level in normal range and within 720 days    No results found for: "VITAMINB12"       Passed - Cr in normal range and within 360 days    Creatinine, Ser  Date Value Ref Range Status  04/23/2023 0.73 0.57 - 1.00 mg/dL Final         Passed - eGFR in normal range and within 360 days    GFR calc Af Amer  Date Value Ref Range Status  11/16/2019 109 >59 mL/min/1.73 Final   GFR calc non Af Amer  Date Value Ref Range Status  11/16/2019 94 >59 mL/min/1.73 Final   eGFR  Date Value Ref Range Status  04/23/2023 101 >59 mL/min/1.73 Final         Passed - Valid encounter within last 6 months    Recent Outpatient Visits           3 months ago Rash and nonspecific skin eruption   [place] [facility] at [facility], Chales [place], MD   7 months ago Pre-diabetes   [facility] at [facility], Chales Colorado, MD   8 months ago Dysuria   [facility] Primary Care & Sports Medicine at [facility], Chales Colorado, MD   9 months ago UTI symptoms   [facility] Primary Care & Sports Medicine at [facility], Chales Colorado, MD   11 months ago Annual physical exam   [facility] Primary Care & Sports Medicine at [facility], Chales Colorado, MD       Future Appointments             In 3 weeks Sheron Dixons, MD  [facility] Primary Care & Sports Medicine at [facility]            Passed - CBC within normal limits and completed in the last 12 months    WBC  Date Value Ref Range Status  12/18/2022 8.8 3.4 - 10.8 x10E3/uL Final  08/27/2009 16.5 (H) 4.0 - 10.5 K/uL Final   RBC  Date Value Ref Range Status  12/18/2022 4.95 3.77 - 5.28 x10E6/uL Final  03/10/2022 5 4.04 - 5.48 M/uL Final  08/27/2009 3.00 (L) 3.87 - 5.11 MIL/uL Final   Hemoglobin  Date Value Ref Range Status  12/18/2022 13.5 11.1 - 15.9 g/dL Final   Hematocrit  Date Value Ref Range Status  12/18/2022 41.4 34.0 - 46.6 % Final   MCHC  Date Value Ref Range Status  12/18/2022 32.6 31.5 - 35.7 g/dL Final  16/08/9603  34.0 30.0 - 36.0 g/dL Final   [facility]  Date Value Ref Range Status  12/18/2022 27.3 26.6 - 33.0 pg Final   MCV  Date Value Ref Range Status  12/18/2022 84 79 - 97 fL Final   No results found for: "PLTCOUNTKUC", "LABPLAT", "POCPLA" RDW  Date Value Ref Range Status  12/18/2022 14.3 11.7 - 15.4 % Final

## 2023-12-25 ENCOUNTER — Ambulatory Visit (INDEPENDENT_AMBULATORY_CARE_PROVIDER_SITE_OTHER): Payer: BC Managed Care – PPO | Admitting: Internal Medicine

## 2023-12-25 ENCOUNTER — Encounter: Payer: Self-pay | Admitting: Internal Medicine

## 2023-12-25 VITALS — BP 126/74 | HR 92 | Ht 68.0 in | Wt 263.0 lb

## 2023-12-25 DIAGNOSIS — I10 Essential (primary) hypertension: Secondary | ICD-10-CM

## 2023-12-25 DIAGNOSIS — Z0000 Encounter for general adult medical examination without abnormal findings: Secondary | ICD-10-CM

## 2023-12-25 DIAGNOSIS — Z6839 Body mass index (BMI) 39.0-39.9, adult: Secondary | ICD-10-CM

## 2023-12-25 DIAGNOSIS — Z124 Encounter for screening for malignant neoplasm of cervix: Secondary | ICD-10-CM | POA: Diagnosis not present

## 2023-12-25 DIAGNOSIS — Z1231 Encounter for screening mammogram for malignant neoplasm of breast: Secondary | ICD-10-CM

## 2023-12-25 DIAGNOSIS — E782 Mixed hyperlipidemia: Secondary | ICD-10-CM

## 2023-12-25 DIAGNOSIS — R7303 Prediabetes: Secondary | ICD-10-CM

## 2023-12-25 NOTE — Progress Notes (Signed)
 Date:  12/25/2023   Name:  Deanna Potts   DOB:  06/13/1976   MRN:  [id number]   Chief Complaint: Annual Exam Deanna Potts is a 48 y.o. female who presents today for her Complete Annual Exam. She feels well. She reports exercising some. She reports she is sleeping fairly well. Breast complaints - none.  Health Maintenance  Topic Date Due   HIV Screening  Never done   COVID-19 Vaccine (4 - 2024-25 season) 07/19/2023   Mammogram  08/20/2023   Pap with HPV screening  02/22/2024   DTaP/Tdap/Td vaccine (3 - Td or Tdap) 02/07/2026   Colon Cancer Screening  08/20/2030   Flu Shot  Completed   Hepatitis C Screening  Completed   HPV Vaccine  Aged Out    Hypertension This is a chronic problem. The problem is controlled. Pertinent negatives include no chest pain, headaches, palpitations or shortness of breath. Past treatments include calcium channel blockers.  Diabetes She presents for her follow-up diabetic visit. Diabetes type: prediabetes. Pertinent negatives for hypoglycemia include no dizziness, headaches or nervousness/anxiousness. Pertinent negatives for diabetes include no chest pain, no fatigue and no weakness.    Review of Systems  Constitutional:  Negative for fatigue and unexpected weight change.  HENT:  Negative for nosebleeds and trouble swallowing.   Eyes:  Negative for visual disturbance.  Respiratory:  Negative for cough, chest tightness, shortness of breath and wheezing.   Cardiovascular:  Negative for chest pain, palpitations and leg swelling.  Gastrointestinal:  Negative for abdominal pain, constipation and diarrhea.  Genitourinary:  Negative for difficulty urinating and menstrual problem (no menses in a year except for mild spotting).  Musculoskeletal:  Negative for arthralgias.  Neurological:  Negative for dizziness, weakness, light-headedness and headaches.  Psychiatric/Behavioral:  Negative for dysphoric mood and sleep disturbance. The patient is not  nervous/anxious.      Lab Results  Component Value Date   NA 138 04/23/2023   K 4.3 04/23/2023   CO2 24 04/23/2023   GLUCOSE 101 (H) 04/23/2023   BUN 10 04/23/2023   CREATININE 0.73 04/23/2023   CALCIUM 9.8 04/23/2023   EGFR 101 04/23/2023   GFRNONAA 94 11/16/2019   Lab Results  Component Value Date   CHOL 203 (H) 12/18/2022   HDL 36 (L) 12/18/2022   LDLCALC 140 (H) 12/18/2022   LDLDIRECT 134.7 08/26/2007   TRIG 149 12/18/2022   CHOLHDL 5.6 (H) 12/18/2022   Lab Results  Component Value Date   TSH 0.794 12/18/2022   Lab Results  Component Value Date   HGBA1C 6.1 (H) 04/23/2023   Lab Results  Component Value Date   WBC 8.8 12/18/2022   HGB 13.5 12/18/2022   HCT 41.4 12/18/2022   MCV 84 12/18/2022   PLT 321 12/18/2022   Lab Results  Component Value Date   ALT 63 (H) 12/18/2022   AST 65 (H) 12/18/2022   ALKPHOS 85 12/18/2022   BILITOT 0.4 12/18/2022   Lab Results  Component Value Date   VD25OH 51.0 12/18/2022     Patient Active Problem List   Diagnosis Date Noted   Obesity, morbid (HCC) 12/25/2023   Vitamin D  deficiency 03/27/2021   Pre-diabetes 11/07/2018   Mixed hyperlipidemia 11/05/2018   Hematuria, undiagnosed cause 05/30/2016   Environmental and seasonal allergies 02/08/2016   Essential hypertension 02/08/2016   Family history of colon cancer in mother 08/06/2015   Dysmenorrhea 08/16/2007    Allergies  Allergen Reactions   Ceftin  [Cefuroxime ] Diarrhea  Bactrim  [Sulfamethoxazole -Trimethoprim ] Itching   Betadine [Povidone Iodine] Itching   Ciprofloxacin  Hives   Neomycin-Bacitracin Zn-Polymyx Itching         Past Surgical History:  Procedure Laterality Date   CHOLECYSTECTOMY  2007   COLONOSCOPY  07/2015   COLONOSCOPY WITH PROPOFOL  N/A 08/20/2020   Procedure: COLONOSCOPY WITH PROPOFOL ;  Surgeon: Jinny Carmine, MD;  Location: [facility];  Service: Endoscopy;  Laterality: N/A;  priority 4   GANGLION CYST EXCISION     Cyst     Social History   Tobacco Use   Smoking status: Former    Current packs/day: 0.00    Average packs/day: 1 pack/day for 12.0 years (12.0 ttl pk-yrs)    Types: Cigarettes    Start date: 07/18/1993    Quit date: 07/18/2005    Years since quitting: 18.4   Smokeless tobacco: Never  Vaping Use   Vaping status: Never Used  Substance Use Topics   Alcohol use: No    Alcohol/week: 0.0 standard drinks of alcohol   Drug use: No     Medication list has been reviewed and updated.  Current Meds  Medication Sig   amLODipine  (NORVASC ) 10 MG tablet Take 1 tablet (10 mg total) by mouth daily.   Cholecalciferol (VITAMIN D3) 50 MCG (2000 UT) TABS Take by mouth daily.   loratadine (CLARITIN) 10 MG tablet Take 10 mg by mouth daily.   metFORMIN  (GLUCOPHAGE -XR) 500 MG 24 hr tablet TAKE 1 TABLET BY MOUTH EVERY DAY WITH BREAKFAST   Multiple Vitamin (MULTIVITAMIN) tablet Take 1 tablet by mouth daily.       12/25/2023    2:15 PM 08/31/2023    3:20 PM 04/23/2023    8:50 AM 04/01/2023    1:31 PM  GAD 7 : Generalized Anxiety Score  Nervous, Anxious, on Edge 0 0 0 0  Control/stop worrying 0 0 0 0  Worry too much - different things 0 0 0 0  Trouble relaxing 0 0 0 0  Restless 0 0 0 0  Easily annoyed or irritable 0 0 0 0  Afraid - awful might happen 0 0 0 0  Total GAD 7 Score 0 0 0 0  Anxiety Difficulty Not difficult at all Not difficult at all Not difficult at all Not difficult at all       12/25/2023    2:15 PM 08/31/2023    3:20 PM 04/23/2023    8:50 AM  Depression screen PHQ 2/9  Decreased Interest 0 0 0  Down, Depressed, Hopeless 0 0 0  PHQ - 2 Score 0 0 0  Altered sleeping 0 0 0  Tired, decreased energy 0 0 0  Change in appetite 0 0 0  Feeling bad or failure about yourself  0 0 0  Trouble concentrating 0 0 0  Moving slowly or fidgety/restless 0 0 0  Suicidal thoughts 0 0 0  PHQ-9 Score 0 0 0  Difficult doing work/chores Not difficult at all Not difficult at all Not difficult at all     BP Readings from Last 3 Encounters:  12/25/23 126/74  08/31/23 118/72  04/23/23 128/76    Physical Exam Vitals and nursing note reviewed.  Constitutional:      General: She is not in acute distress.    Appearance: She is well-developed.  HENT:     Head: Normocephalic and atraumatic.     Right Ear: Tympanic membrane and ear canal normal.     Left Ear: Tympanic membrane and ear canal  normal.     Nose:     Right Sinus: No maxillary sinus tenderness.     Left Sinus: No maxillary sinus tenderness.  Eyes:     General: No scleral icterus.       Right eye: No discharge.        Left eye: No discharge.     Conjunctiva/sclera: Conjunctivae normal.  Neck:     Thyroid : No thyromegaly.     Vascular: No carotid bruit.  Cardiovascular:     Rate and Rhythm: Normal rate and regular rhythm.     Pulses: Normal pulses.     Heart sounds: Normal heart sounds.  Pulmonary:     Effort: Pulmonary effort is normal. No respiratory distress.     Breath sounds: No wheezing.  Abdominal:     General: Bowel sounds are normal.     Palpations: Abdomen is soft.     Tenderness: There is no abdominal tenderness.  Musculoskeletal:     Cervical back: Normal range of motion. No erythema.     Right lower leg: No edema.     Left lower leg: No edema.  Lymphadenopathy:     Cervical: No cervical adenopathy.  Skin:    General: Skin is warm and dry.     Capillary Refill: Capillary refill takes less than 2 seconds.     Findings: No rash.  Neurological:     General: No focal deficit present.     Mental Status: She is alert and oriented to person, place, and time.     Cranial Nerves: No cranial nerve deficit.     Sensory: No sensory deficit.     Deep Tendon Reflexes: Reflexes are normal and symmetric.  Psychiatric:        Attention and Perception: Attention normal.        Mood and Affect: Mood normal.        Behavior: Behavior normal.     Wt Readings from Last 3 Encounters:  12/25/23 263 lb (119.3 kg)   08/31/23 265 lb (120.2 kg)  04/23/23 277 lb 9.6 oz (125.9 kg)    BP 126/74 (BP Location: Left Arm, Cuff Size: Large)   Pulse 92   Ht 5' 8 (1.727 m)   Wt 263 lb (119.3 kg)   SpO2 98%   BMI 39.99 kg/m   Assessment and Plan:  Problem List Items Addressed This Visit       Unprioritized   Essential hypertension (Chronic)   Controlled BP with normal exam. Current regimen is amlodipine . Will continue same medications; encourage continued reduced sodium diet.       Relevant Orders   CBC with Differential/Platelet   Comprehensive metabolic panel   TSH   Mixed hyperlipidemia (Chronic)   Relevant Orders   Lipid panel   Pre-diabetes (Chronic)   Managed with diet and exercise. Lab Results  Component Value Date   HGBA1C 6.1 (H) 04/23/2023         Relevant Orders   Hemoglobin A1c   Obesity, morbid (HCC)   Other Visit Diagnoses       Annual physical exam    -  Primary   Relevant Orders   CBC with Differential/Platelet   Comprehensive metabolic panel   Hemoglobin A1c   Lipid panel   TSH     Encounter for screening mammogram for breast cancer       done by GYN - will request report     Encounter for screening for cervical cancer  being done by GYN       Return in about 6 months (around 06/23/2024) for HTN, preDM.    Leita HILARIO Adie, MD [facility] [place]

## 2023-12-25 NOTE — Assessment & Plan Note (Signed)
 Managed with diet and exercise. Lab Results  Component Value Date   HGBA1C 6.1 (H) 04/23/2023

## 2023-12-25 NOTE — Assessment & Plan Note (Signed)
 Controlled BP with normal exam. Current regimen is amlodipine. Will continue same medications; encourage continued reduced sodium diet.

## 2023-12-26 LAB — COMPREHENSIVE METABOLIC PANEL
ALT: 39 [IU]/L — ABNORMAL HIGH (ref 0–32)
AST: 29 [IU]/L (ref 0–40)
Albumin: 4.2 g/dL (ref 3.9–4.9)
Alkaline Phosphatase: 91 [IU]/L (ref 44–121)
BUN/Creatinine Ratio: 13 (ref 9–23)
BUN: 9 mg/dL (ref 6–24)
Bilirubin Total: 0.3 mg/dL (ref 0.0–1.2)
CO2: 22 mmol/L (ref 20–29)
Calcium: 10.2 mg/dL (ref 8.7–10.2)
Chloride: 100 mmol/L (ref 96–106)
Creatinine, Ser: 0.69 mg/dL (ref 0.57–1.00)
Globulin, Total: 3.2 g/dL (ref 1.5–4.5)
Glucose: 102 mg/dL — ABNORMAL HIGH (ref 70–99)
Potassium: 4.2 mmol/L (ref 3.5–5.2)
Sodium: 138 mmol/L (ref 134–144)
Total Protein: 7.4 g/dL (ref 6.0–8.5)
eGFR: 107 mL/min/{1.73_m2} (ref >59)

## 2023-12-26 LAB — LIPID PANEL
Chol/HDL Ratio: 6.7 {ratio} — ABNORMAL HIGH (ref 0.0–4.4)
Cholesterol, Total: 233 mg/dL — ABNORMAL HIGH (ref 100–199)
HDL: 35 mg/dL — ABNORMAL LOW (ref >39)
LDL Chol Calc (NIH): 164 mg/dL — ABNORMAL HIGH (ref 0–99)
Triglycerides: 185 mg/dL — ABNORMAL HIGH (ref 0–149)
VLDL Cholesterol Cal: 34 mg/dL (ref 5–40)

## 2023-12-26 LAB — CBC WITH DIFFERENTIAL/PLATELET
Basophils Absolute: 0 10*3/uL (ref 0.0–0.2)
Basos: 1 %
EOS (ABSOLUTE): 0.4 10*3/uL (ref 0.0–0.4)
Eos: 4 %
Hematocrit: 42.3 % (ref 34.0–46.6)
Hemoglobin: 13.9 g/dL (ref 11.1–15.9)
Immature Grans (Abs): 0 10*3/uL (ref 0.0–0.1)
Immature Granulocytes: 0 %
Lymphocytes Absolute: 2.2 10*3/uL (ref 0.7–3.1)
Lymphs: 26 %
MCH: 27.3 pg (ref 26.6–33.0)
MCHC: 32.9 g/dL (ref 31.5–35.7)
MCV: 83 fL (ref 79–97)
Monocytes Absolute: 0.6 10*3/uL (ref 0.1–0.9)
Monocytes: 7 %
Neutrophils Absolute: 5.2 10*3/uL (ref 1.4–7.0)
Neutrophils: 62 %
Platelets: 360 10*3/uL (ref 150–450)
RBC: 5.09 x10E6/uL (ref 3.77–5.28)
RDW: 13.4 % (ref 11.7–15.4)
WBC: 8.4 10*3/uL (ref 3.4–10.8)

## 2023-12-26 LAB — HEMOGLOBIN A1C
Est. average glucose Bld gHb Est-mCnc: 134 mg/dL
Hgb A1c MFr Bld: 6.3 % — ABNORMAL HIGH (ref 4.8–5.6)

## 2023-12-26 LAB — TSH: TSH: 0.862 u[IU]/mL (ref 0.450–4.500)

## 2023-12-28 ENCOUNTER — Encounter: Payer: Self-pay | Admitting: Internal Medicine

## 2024-01-08 ENCOUNTER — Telehealth: Payer: BC Managed Care – PPO | Admitting: Family Medicine

## 2024-01-08 DIAGNOSIS — N390 Urinary tract infection, site not specified: Secondary | ICD-10-CM | POA: Diagnosis not present

## 2024-01-08 MED ORDER — CEPHALEXIN 500 MG PO CAPS: 500.0000 mg | ORAL_CAPSULE | Freq: Two times a day (BID) | ORAL | 0 refills | Status: AC

## 2024-01-08 NOTE — Progress Notes (Signed)

## 2024-02-24 ENCOUNTER — Other Ambulatory Visit: Payer: Self-pay | Admitting: Internal Medicine

## 2024-02-24 DIAGNOSIS — E118 Type 2 diabetes mellitus with unspecified complications: Secondary | ICD-10-CM

## 2024-02-25 NOTE — Telephone Encounter (Signed)
 Requested Prescriptions  Pending Prescriptions Disp Refills   metFORMIN (GLUCOPHAGE-XR) 500 MG 24 hr tablet [Pharmacy Med Name: METFORMIN HCL ER 500 MG TABLET] 90 tablet 0    Sig: TAKE 1 TABLET BY MOUTH EVERY DAY WITH BREAKFAST     Endocrinology:  Diabetes - Biguanides Failed - 02/25/2024 10:11 AM      Failed - B12 Level in normal range and within 720 days    No results found for: "VITAMINB12"       Passed - Cr in normal range and within 360 days    Creatinine, Ser  Date Value Ref Range Status  12/25/2023 0.69 0.57 - 1.00 mg/dL Final         Passed - HBA1C is between 0 and 7.9 and within 180 days    Hgb A1c MFr Bld  Date Value Ref Range Status  12/25/2023 6.3 (H) 4.8 - 5.6 % Final    Comment:             Prediabetes: 5.7 - 6.4          Diabetes: >6.4          Glycemic control for adults with diabetes: <7.0          Passed - eGFR in normal range and within 360 days    GFR calc Af Amer  Date Value Ref Range Status  11/16/2019 109 >59 mL/min/1.73 Final   GFR calc non Af Amer  Date Value Ref Range Status  11/16/2019 94 >59 mL/min/1.73 Final   eGFR  Date Value Ref Range Status  12/25/2023 107 >59 mL/min/1.73 Final         Passed - Valid encounter within last 6 months    Recent Outpatient Visits           2 months ago Annual physical exam   [place] [facility] at [facility], Nyoka Cowden, MD              Passed - CBC within normal limits and completed in the last 12 months    WBC  Date Value Ref Range Status  12/25/2023 8.4 3.4 - 10.8 x10E3/uL Final  08/27/2009 16.5 (H) 4.0 - 10.5 K/uL Final   RBC  Date Value Ref Range Status  12/25/2023 5.09 3.77 - 5.28 x10E6/uL Final  03/10/2022 5 4.04 - 5.48 M/uL Final  08/27/2009 3.00 (L) 3.87 - 5.11 MIL/uL Final   Hemoglobin  Date Value Ref Range Status  12/25/2023 13.9 11.1 - 15.9 g/dL Final   Hematocrit  Date Value Ref Range Status  12/25/2023 42.3 34.0 - 46.6 % Final   MCHC   Date Value Ref Range Status  12/25/2023 32.9 31.5 - 35.7 g/dL Final  16/08/9603 54.0 30.0 - 36.0 g/dL Final   [facility]  Date Value Ref Range Status  12/25/2023 27.3 26.6 - 33.0 pg Final   MCV  Date Value Ref Range Status  12/25/2023 83 79 - 97 fL Final   No results found for: "PLTCOUNTKUC", "LABPLAT", "POCPLA" RDW  Date Value Ref Range Status  12/25/2023 13.4 11.7 - 15.4 % Final

## 2024-02-28 ENCOUNTER — Other Ambulatory Visit: Payer: Self-pay | Admitting: Internal Medicine

## 2024-02-28 DIAGNOSIS — I10 Essential (primary) hypertension: Secondary | ICD-10-CM

## 2024-02-29 NOTE — Telephone Encounter (Signed)
 Requested Prescriptions  Pending Prescriptions Disp Refills   amLODipine (NORVASC) 10 MG tablet [Pharmacy Med Name: AMLODIPINE BESYLATE 10 MG TAB] 90 tablet 0    Sig: TAKE 1 TABLET BY MOUTH EVERY DAY     Cardiovascular: Calcium Channel Blockers 2 Passed - 02/29/2024  4:26 PM      Passed - Last BP in normal range    BP Readings from Last 1 Encounters:  12/25/23 126/74         Passed - Last Heart Rate in normal range    Pulse Readings from Last 1 Encounters:  12/25/23 92         Passed - Valid encounter within last 6 months    Recent Outpatient Visits           2 months ago Annual physical exam   [facility] at [facility], Chales [place], MD

## 2024-05-21 ENCOUNTER — Other Ambulatory Visit: Payer: Self-pay | Admitting: Internal Medicine

## 2024-05-21 DIAGNOSIS — E118 Type 2 diabetes mellitus with unspecified complications: Secondary | ICD-10-CM

## 2024-05-24 NOTE — Telephone Encounter (Signed)
 Requested Prescriptions  Pending Prescriptions Disp Refills   metFORMIN  (GLUCOPHAGE -XR) 500 MG 24 hr tablet [Pharmacy Med Name: METFORMIN  HCL ER 500 MG TABLET] 90 tablet 0    Sig: TAKE 1 TABLET BY MOUTH EVERY DAY WITH BREAKFAST     Endocrinology:  Diabetes - Biguanides Failed - 05/24/2024 12:20 PM      Failed - B12 Level in normal range and within 720 days    No results found for: VITAMINB12       Passed - Cr in normal range and within 360 days    Creatinine, Ser  Date Value Ref Range Status  12/25/2023 0.69 0.57 - 1.00 mg/dL Final         Passed - HBA1C is between 0 and 7.9 and within 180 days    Hgb A1c MFr Bld  Date Value Ref Range Status  12/25/2023 6.3 (H) 4.8 - 5.6 % Final    Comment:             Prediabetes: 5.7 - 6.4          Diabetes: >6.4          Glycemic control for adults with diabetes: <7.0          Passed - eGFR in normal range and within 360 days    GFR calc Af Amer  Date Value Ref Range Status  11/16/2019 109 >59 mL/min/1.73 Final   GFR calc non Af Amer  Date Value Ref Range Status  11/16/2019 94 >59 mL/min/1.73 Final   eGFR  Date Value Ref Range Status  12/25/2023 107 >59 mL/min/1.73 Final         Passed - Valid encounter within last 6 months    Recent Outpatient Visits           5 months ago Annual physical exam   [place] [facility] at [facility], Leita DEL, MD              Passed - CBC within normal limits and completed in the last 12 months    WBC  Date Value Ref Range Status  12/25/2023 8.4 3.4 - 10.8 x10E3/uL Final  08/27/2009 16.5 (H) 4.0 - 10.5 K/uL Final   RBC  Date Value Ref Range Status  12/25/2023 5.09 3.77 - 5.28 x10E6/uL Final  03/10/2022 5 4.04 - 5.48 M/uL Final  08/27/2009 3.00 (L) 3.87 - 5.11 MIL/uL Final   Hemoglobin  Date Value Ref Range Status  12/25/2023 13.9 11.1 - 15.9 g/dL Final   Hematocrit  Date Value Ref Range Status  12/25/2023 42.3 34.0 - 46.6 % Final   MCHC   Date Value Ref Range Status  12/25/2023 32.9 31.5 - 35.7 g/dL Final  89/88/7989 65.9 30.0 - 36.0 g/dL Final   [facility]  Date Value Ref Range Status  12/25/2023 27.3 26.6 - 33.0 pg Final   MCV  Date Value Ref Range Status  12/25/2023 83 79 - 97 fL Final   No results found for: PLTCOUNTKUC, LABPLAT, POCPLA RDW  Date Value Ref Range Status  12/25/2023 13.4 11.7 - 15.4 % Final

## 2024-05-26 ENCOUNTER — Other Ambulatory Visit: Payer: Self-pay | Admitting: Internal Medicine

## 2024-05-26 DIAGNOSIS — I10 Essential (primary) hypertension: Secondary | ICD-10-CM

## 2024-05-27 NOTE — Telephone Encounter (Signed)
 Requested Prescriptions  Pending Prescriptions Disp Refills   amLODipine  (NORVASC ) 10 MG tablet [Pharmacy Med Name: AMLODIPINE  BESYLATE 10 MG TAB] 90 tablet 0    Sig: TAKE 1 TABLET BY MOUTH EVERY DAY     Cardiovascular: Calcium Channel Blockers 2 Passed - 05/27/2024  2:15 PM      Passed - Last BP in normal range    BP Readings from Last 1 Encounters:  12/25/23 126/74         Passed - Last Heart Rate in normal range    Pulse Readings from Last 1 Encounters:  12/25/23 92         Passed - Valid encounter within last 6 months    Recent Outpatient Visits           5 months ago Annual physical exam   [facility] at [facility], Leita DEL, MD

## 2024-06-02 ENCOUNTER — Telehealth: Admitting: Physician Assistant

## 2024-06-02 DIAGNOSIS — R3989 Other symptoms and signs involving the genitourinary system: Secondary | ICD-10-CM | POA: Diagnosis not present

## 2024-06-02 MED ORDER — NITROFURANTOIN MONOHYD MACRO 100 MG PO CAPS: 100.0000 mg | ORAL_CAPSULE | Freq: Two times a day (BID) | ORAL | 0 refills | Status: DC

## 2024-06-02 NOTE — Progress Notes (Signed)

## 2024-06-02 NOTE — Progress Notes (Signed)
 I have spent 5 minutes in review of e-visit questionnaire, review and updating patient chart, medical decision making and response to patient.   Piedad Climes, PA-C

## 2024-06-07 ENCOUNTER — Ambulatory Visit: Admitting: Internal Medicine

## 2024-06-07 ENCOUNTER — Other Ambulatory Visit: Payer: Self-pay | Admitting: Internal Medicine

## 2024-06-07 ENCOUNTER — Encounter: Payer: Self-pay | Admitting: Internal Medicine

## 2024-06-07 VITALS — BP 128/78 | HR 98 | Temp 97.6°F | Ht 68.0 in | Wt 259.0 lb

## 2024-06-07 DIAGNOSIS — R399 Unspecified symptoms and signs involving the genitourinary system: Secondary | ICD-10-CM

## 2024-06-07 LAB — POCT URINALYSIS DIPSTICK
Bilirubin, UA: NEGATIVE
Glucose, UA: NEGATIVE
Ketones, UA: NEGATIVE
Nitrite, UA: NEGATIVE
Protein, UA: NEGATIVE
Spec Grav, UA: 1.02 (ref 1.010–1.025)
Urobilinogen, UA: 0.2 U/dL
pH, UA: 6.5 (ref 5.0–8.0)

## 2024-06-07 MED ORDER — AMOXICILLIN-POT CLAVULANATE 875-125 MG PO TABS: 1.0000 | ORAL_TABLET | Freq: Two times a day (BID) | ORAL | 0 refills | Status: AC

## 2024-06-07 NOTE — Progress Notes (Signed)
 Date:  06/07/2024   Name:  Deanna Potts   DOB:  1976/01/29   MRN:  [id number]   Chief Complaint: Urinary Tract Infection (Dysuria and Rt flank pain. Pt currently on abx and has one day left. Pt did an E-visit to get the initial abx. Felt better in the beginning but last night symptoms felt bad again. Right flank pain was worse. Pt does have hx of kidney stones.)  Urinary Tract Infection  This is a recurrent problem. The current episode started in the past 7 days. The problem occurs every urination. The problem has been unchanged. The quality of the pain is described as burning. The pain is mild. There has been no fever. Associated symptoms include flank pain (right sided), hesitancy and urgency. Pertinent negatives include no chills, discharge, hematuria or nausea. Treatments tried: Macrobid  from an e-visit. Improvement on treatment: improved briefly but now sx returned.    Review of Systems  Constitutional:  Negative for chills, fatigue and fever.  Respiratory:  Negative for chest tightness and shortness of breath.   Cardiovascular:  Negative for chest pain.  Gastrointestinal:  Negative for nausea.  Genitourinary:  Positive for dysuria, flank pain (right sided), hesitancy and urgency. Negative for hematuria.     Lab Results  Component Value Date   NA 138 12/25/2023   K 4.2 12/25/2023   CO2 22 12/25/2023   GLUCOSE 102 (H) 12/25/2023   BUN 9 12/25/2023   CREATININE 0.69 12/25/2023   CALCIUM 10.2 12/25/2023   EGFR 107 12/25/2023   GFRNONAA 94 11/16/2019   Lab Results  Component Value Date   CHOL 233 (H) 12/25/2023   HDL 35 (L) 12/25/2023   LDLCALC 164 (H) 12/25/2023   LDLDIRECT 134.7 08/26/2007   TRIG 185 (H) 12/25/2023   CHOLHDL 6.7 (H) 12/25/2023   Lab Results  Component Value Date   TSH 0.862 12/25/2023   Lab Results  Component Value Date   HGBA1C 6.3 (H) 12/25/2023   Lab Results  Component Value Date   WBC 8.4 12/25/2023   HGB 13.9 12/25/2023   HCT 42.3  12/25/2023   MCV 83 12/25/2023   PLT 360 12/25/2023   Lab Results  Component Value Date   ALT 39 (H) 12/25/2023   AST 29 12/25/2023   ALKPHOS 91 12/25/2023   BILITOT 0.3 12/25/2023   Lab Results  Component Value Date   VD25OH 51.0 12/18/2022     Patient Active Problem List   Diagnosis Date Noted   Obesity, morbid (HCC) 12/25/2023   Vitamin D  deficiency 03/27/2021   Pre-diabetes 11/07/2018   Mixed hyperlipidemia 11/05/2018   Hematuria, undiagnosed cause 05/30/2016   Environmental and seasonal allergies 02/08/2016   Essential hypertension 02/08/2016   Family history of colon cancer in mother 08/06/2015   Dysmenorrhea 08/16/2007    Allergies  Allergen Reactions   Ceftin  [Cefuroxime ] Diarrhea   Bactrim  [Sulfamethoxazole -Trimethoprim ] Itching   Betadine [Povidone Iodine] Itching   Ciprofloxacin  Hives   Neomycin-Bacitracin Zn-Polymyx Itching         Past Surgical History:  Procedure Laterality Date   CHOLECYSTECTOMY  2007   COLONOSCOPY  07/2015   COLONOSCOPY WITH PROPOFOL  N/A 08/20/2020   Procedure: COLONOSCOPY WITH PROPOFOL ;  Surgeon: Jinny Carmine, MD;  Location: [facility];  Service: Endoscopy;  Laterality: N/A;  priority 4   GANGLION CYST EXCISION     Cyst    Social History   Tobacco Use   Smoking status: Former    Current packs/day: 0.00  Average packs/day: 1 pack/day for 12.0 years (12.0 ttl pk-yrs)    Types: Cigarettes    Start date: 07/18/1993    Quit date: 07/18/2005    Years since quitting: 18.9   Smokeless tobacco: Never  Vaping Use   Vaping status: Never Used  Substance Use Topics   Alcohol use: No    Alcohol/week: 0.0 standard drinks of alcohol   Drug use: No     Medication list has been reviewed and updated.  Current Meds  Medication Sig   amLODipine  (NORVASC ) 10 MG tablet TAKE 1 TABLET BY MOUTH EVERY DAY   amoxicillin -clavulanate (AUGMENTIN ) 875-125 MG tablet Take 1 tablet by mouth 2 (two) times daily for 7 days.    Cholecalciferol (VITAMIN D3) 50 MCG (2000 UT) TABS Take by mouth daily.   loratadine (CLARITIN) 10 MG tablet Take 10 mg by mouth daily.   metFORMIN  (GLUCOPHAGE -XR) 500 MG 24 hr tablet TAKE 1 TABLET BY MOUTH EVERY DAY WITH BREAKFAST   Multiple Vitamin (MULTIVITAMIN) tablet Take 1 tablet by mouth daily.   [DISCONTINUED] nitrofurantoin , macrocrystal-monohydrate, (MACROBID ) 100 MG capsule Take 1 capsule (100 mg total) by mouth 2 (two) times daily.       06/07/2024   10:35 AM 12/25/2023    2:15 PM 08/31/2023    3:20 PM 04/23/2023    8:50 AM  GAD 7 : Generalized Anxiety Score  Nervous, Anxious, on Edge 0 0 0 0  Control/stop worrying 0 0 0 0  Worry too much - different things 0 0 0 0  Trouble relaxing 0 0 0 0  Restless 0 0 0 0  Easily annoyed or irritable 0 0 0 0  Afraid - awful might happen 0 0 0 0  Total GAD 7 Score 0 0 0 0  Anxiety Difficulty Not difficult at all Not difficult at all Not difficult at all Not difficult at all       06/07/2024   10:35 AM 12/25/2023    2:15 PM 08/31/2023    3:20 PM  Depression screen PHQ 2/9  Decreased Interest 0 0 0  Down, Depressed, Hopeless 0 0 0  PHQ - 2 Score 0 0 0  Altered sleeping 0 0 0  Tired, decreased energy 0 0 0  Change in appetite 0 0 0  Feeling bad or failure about yourself  0 0 0  Trouble concentrating 0 0 0  Moving slowly or fidgety/restless 0 0 0  Suicidal thoughts 0 0 0  PHQ-9 Score 0 0 0  Difficult doing work/chores Not difficult at all Not difficult at all Not difficult at all    BP Readings from Last 3 Encounters:  06/07/24 128/78  12/25/23 126/74  08/31/23 118/72    Physical Exam Vitals and nursing note reviewed.  Constitutional:      General: She is not in acute distress.    Appearance: Normal appearance. She is well-developed.  HENT:     Head: Normocephalic and atraumatic.  Cardiovascular:     Rate and Rhythm: Normal rate and regular rhythm.  Pulmonary:     Effort: Pulmonary effort is normal. No respiratory  distress.     Breath sounds: No wheezing or rhonchi.  Abdominal:     Palpations: Abdomen is soft.     Tenderness: There is no abdominal tenderness. There is no right CVA tenderness, left CVA tenderness or guarding.  Skin:    General: Skin is warm and dry.     Findings: No rash.  Neurological:  Mental Status: She is alert and oriented to person, place, and time.  Psychiatric:        Mood and Affect: Mood normal.        Behavior: Behavior normal.    Lab Results  Component Value Date   COLORU yellow 06/07/2024   CLARITYU cloudy 06/07/2024   GLUCOSEUR Negative 06/07/2024   BILIRUBINUR neg 06/07/2024   KETONESU neg 06/07/2024   SPECGRAV 1.020 06/07/2024   RBCUR small 1+ (A) 06/07/2024   PHUR 6.5 06/07/2024   PROTEINUR Negative 06/07/2024   UROBILINOGEN 0.2 06/07/2024   LEUKOCYTESUR Large (3+) (A) 06/07/2024      Wt Readings from Last 3 Encounters:  06/07/24 259 lb (117.5 kg)  12/25/23 263 lb (119.3 kg)  08/31/23 265 lb (120.2 kg)    BP 128/78   Pulse 98   Temp 97.6 F (36.4 C) (Oral)   Ht 5' 8 (1.727 m)   Wt 259 lb (117.5 kg)   SpO2 97%   BMI 39.38 kg/m   Assessment and Plan:  Problem List Items Addressed This Visit   None Visit Diagnoses       UTI symptoms    -  Primary   hx of left renal stones; recurrent sx while on Macrobid  will get culture treat with Augmentin  x 7 days   Relevant Medications   amoxicillin -clavulanate (AUGMENTIN ) 875-125 MG tablet   Other Relevant Orders   Urine Culture   POCT urinalysis dipstick (Completed)       No follow-ups on file.    Leita HILARIO Adie, MD [facility] [place]

## 2024-06-09 ENCOUNTER — Ambulatory Visit: Payer: Self-pay | Admitting: Internal Medicine

## 2024-06-09 LAB — URINE CULTURE

## 2024-08-03 ENCOUNTER — Telehealth: Admitting: Physician Assistant

## 2024-08-03 DIAGNOSIS — R3989 Other symptoms and signs involving the genitourinary system: Secondary | ICD-10-CM | POA: Diagnosis not present

## 2024-08-03 MED ORDER — NITROFURANTOIN MONOHYD MACRO 100 MG PO CAPS: 100.0000 mg | ORAL_CAPSULE | Freq: Two times a day (BID) | ORAL | 0 refills | Status: DC

## 2024-08-03 NOTE — Progress Notes (Signed)

## 2024-08-03 NOTE — Progress Notes (Signed)
 Message sent to patient requesting further input regarding current symptoms. Awaiting patient response.

## 2024-08-03 NOTE — Progress Notes (Signed)
 I have spent 5 minutes in review of e-visit questionnaire, review and updating patient chart, medical decision making and response to patient.   Elsie Velma Lunger, PA-C

## 2024-08-25 ENCOUNTER — Other Ambulatory Visit: Payer: Self-pay | Admitting: Internal Medicine

## 2024-08-25 DIAGNOSIS — E118 Type 2 diabetes mellitus with unspecified complications: Secondary | ICD-10-CM

## 2024-08-25 DIAGNOSIS — I10 Essential (primary) hypertension: Secondary | ICD-10-CM

## 2024-08-26 NOTE — Telephone Encounter (Signed)
 Requested Prescriptions  Pending Prescriptions Disp Refills   metFORMIN  (GLUCOPHAGE -XR) 500 MG 24 hr tablet [Pharmacy Med Name: METFORMIN  HCL ER 500 MG TABLET] 90 tablet 0    Sig: TAKE 1 TABLET BY MOUTH EVERY DAY WITH BREAKFAST     Endocrinology:  Diabetes - Biguanides Failed - 08/26/2024  3:07 PM      Failed - HBA1C is between 0 and 7.9 and within 180 days    Hgb A1c MFr Bld  Date Value Ref Range Status  12/25/2023 6.3 (H) 4.8 - 5.6 % Final    Comment:             Prediabetes: 5.7 - 6.4          Diabetes: >6.4          Glycemic control for adults with diabetes: <7.0          Failed - B12 Level in normal range and within 720 days    No results found for: VITAMINB12       Failed - Valid encounter within last 6 months    Recent Outpatient Visits           2 months ago UTI symptoms   [place] [facility] at [facility], Leita DEL, MD   8 months ago Annual physical exam   [facility] at [facility], Leita DEL, MD              Passed - Cr in normal range and within 360 days    Creatinine, Ser  Date Value Ref Range Status  12/25/2023 0.69 0.57 - 1.00 mg/dL Final         Passed - eGFR in normal range and within 360 days    GFR calc Af Amer  Date Value Ref Range Status  11/16/2019 109 >59 mL/min/1.73 Final   GFR calc non Af Amer  Date Value Ref Range Status  11/16/2019 94 >59 mL/min/1.73 Final   eGFR  Date Value Ref Range Status  12/25/2023 107 >59 mL/min/1.73 Final         Passed - CBC within normal limits and completed in the last 12 months    WBC  Date Value Ref Range Status  12/25/2023 8.4 3.4 - 10.8 x10E3/uL Final  08/27/2009 16.5 (H) 4.0 - 10.5 K/uL Final   RBC  Date Value Ref Range Status  12/25/2023 5.09 3.77 - 5.28 x10E6/uL Final  03/10/2022 5 4.04 - 5.48 M/uL Final  08/27/2009 3.00 (L) 3.87 - 5.11 MIL/uL Final   Hemoglobin  Date Value Ref Range Status  12/25/2023 13.9  11.1 - 15.9 g/dL Final   Hematocrit  Date Value Ref Range Status  12/25/2023 42.3 34.0 - 46.6 % Final   MCHC  Date Value Ref Range Status  12/25/2023 32.9 31.5 - 35.7 g/dL Final  89/88/7989 65.9 30.0 - 36.0 g/dL Final   [facility]  Date Value Ref Range Status  12/25/2023 27.3 26.6 - 33.0 pg Final   MCV  Date Value Ref Range Status  12/25/2023 83 79 - 97 fL Final   No results found for: PLTCOUNTKUC, LABPLAT, POCPLA RDW  Date Value Ref Range Status  12/25/2023 13.4 11.7 - 15.4 % Final          amLODipine  (NORVASC ) 10 MG tablet [Pharmacy Med Name: AMLODIPINE  BESYLATE 10 MG TAB] 90 tablet 0    Sig: TAKE 1 TABLET BY MOUTH EVERY DAY     Cardiovascular: Calcium  Channel Blockers 2 Failed - 08/26/2024  3:07 PM      Failed - Valid encounter within last 6 months    Recent Outpatient Visits           2 months ago UTI symptoms   [place] [facility] at [facility], Leita DEL, MD   8 months ago Annual physical exam   [facility] at [facility], Leita DEL, MD              Passed - Last BP in normal range    BP Readings from Last 1 Encounters:  06/07/24 128/78         Passed - Last Heart Rate in normal range    Pulse Readings from Last 1 Encounters:  06/07/24 98

## 2024-09-13 ENCOUNTER — Ambulatory Visit: Admitting: Internal Medicine

## 2024-11-24 ENCOUNTER — Telehealth: Admitting: Family Medicine

## 2024-11-24 DIAGNOSIS — R3989 Other symptoms and signs involving the genitourinary system: Secondary | ICD-10-CM

## 2024-11-24 MED ORDER — NITROFURANTOIN MONOHYD MACRO 100 MG PO CAPS: 100.0000 mg | ORAL_CAPSULE | Freq: Two times a day (BID) | ORAL | 0 refills | Status: AC

## 2024-11-24 NOTE — Progress Notes (Signed)

## 2024-11-30 ENCOUNTER — Telehealth: Admitting: Emergency Medicine

## 2024-11-30 DIAGNOSIS — R3989 Other symptoms and signs involving the genitourinary system: Secondary | ICD-10-CM

## 2024-11-30 NOTE — Progress Notes (Signed)
" °  Because you have already been treated for a uti and did not get better, I am not able to help you by evisit. You will need urine testing so your condition warrants further evaluation and I recommend that you be seen in a face-to-face visit.   NOTE: There will be NO CHARGE for this E-Visit   If you are having a true medical emergency, please call 911.     For an urgent face to face visit, [place] has multiple urgent care centers for your convenience.  Click the link below for the full list of locations and hours, walk-in wait times, appointment scheduling options and driving directions:  Urgent Care - [place], [place], [place], [place], [place], [place]  [place]     Your MyChart E-visit questionnaire answers were reviewed by a board certified advanced clinical practitioner to complete your personal care plan based on your specific symptoms.    Thank you for using e-Visits.    "

## 2025-01-11 ENCOUNTER — Ambulatory Visit: Admitting: Family Medicine
